# Patient Record
Sex: Female | Born: 1970 | Race: White | Hispanic: No | Marital: Married | Smoking: Never smoker
Health system: Southern US, Community
[De-identification: ages and names within clinical notes are randomized; demographics above are authoritative.]

## PROBLEM LIST (undated history)

## (undated) DIAGNOSIS — K219 Gastro-esophageal reflux disease without esophagitis: Secondary | ICD-10-CM

## (undated) DIAGNOSIS — E039 Hypothyroidism, unspecified: Secondary | ICD-10-CM

## (undated) DIAGNOSIS — F419 Anxiety disorder, unspecified: Secondary | ICD-10-CM

## (undated) DIAGNOSIS — F32A Depression, unspecified: Secondary | ICD-10-CM

## (undated) DIAGNOSIS — M1990 Unspecified osteoarthritis, unspecified site: Secondary | ICD-10-CM

## (undated) DIAGNOSIS — I10 Essential (primary) hypertension: Secondary | ICD-10-CM

## (undated) DIAGNOSIS — F329 Major depressive disorder, single episode, unspecified: Secondary | ICD-10-CM

## (undated) DIAGNOSIS — R928 Other abnormal and inconclusive findings on diagnostic imaging of breast: Secondary | ICD-10-CM

## (undated) HISTORY — PX: BREAST SURGERY: SHX581

## (undated) HISTORY — PX: CHOLECYSTECTOMY: SHX55

---

## 2000-10-26 ENCOUNTER — Encounter: Admission: RE | Admit: 2000-10-26 | Discharge: 2000-10-26 | Payer: Self-pay

## 2015-12-22 DIAGNOSIS — Z1389 Encounter for screening for other disorder: Secondary | ICD-10-CM | POA: Diagnosis not present

## 2015-12-22 DIAGNOSIS — I10 Essential (primary) hypertension: Secondary | ICD-10-CM | POA: Diagnosis not present

## 2015-12-22 DIAGNOSIS — E041 Nontoxic single thyroid nodule: Secondary | ICD-10-CM | POA: Diagnosis not present

## 2015-12-22 DIAGNOSIS — K219 Gastro-esophageal reflux disease without esophagitis: Secondary | ICD-10-CM | POA: Diagnosis not present

## 2015-12-23 DIAGNOSIS — Z6841 Body Mass Index (BMI) 40.0 and over, adult: Secondary | ICD-10-CM | POA: Diagnosis not present

## 2015-12-23 DIAGNOSIS — Z01419 Encounter for gynecological examination (general) (routine) without abnormal findings: Secondary | ICD-10-CM | POA: Diagnosis not present

## 2015-12-24 ENCOUNTER — Other Ambulatory Visit: Payer: Self-pay | Admitting: Obstetrics & Gynecology

## 2015-12-24 DIAGNOSIS — R922 Inconclusive mammogram: Secondary | ICD-10-CM

## 2015-12-30 ENCOUNTER — Other Ambulatory Visit: Payer: Self-pay | Admitting: Obstetrics & Gynecology

## 2015-12-30 ENCOUNTER — Ambulatory Visit
Admission: RE | Admit: 2015-12-30 | Discharge: 2015-12-30 | Disposition: A | Discharge status: Home / Self Care | Payer: BLUE CROSS/BLUE SHIELD | Source: Ambulatory Visit | Attending: Obstetrics & Gynecology | Admitting: Obstetrics & Gynecology

## 2015-12-30 DIAGNOSIS — R922 Inconclusive mammogram: Secondary | ICD-10-CM

## 2015-12-30 DIAGNOSIS — N63 Unspecified lump in breast: Secondary | ICD-10-CM | POA: Diagnosis not present

## 2015-12-31 DIAGNOSIS — E669 Obesity, unspecified: Secondary | ICD-10-CM | POA: Diagnosis not present

## 2015-12-31 DIAGNOSIS — E010 Iodine-deficiency related diffuse (endemic) goiter: Secondary | ICD-10-CM | POA: Diagnosis not present

## 2016-01-08 ENCOUNTER — Ambulatory Visit
Admission: RE | Admit: 2016-01-08 | Discharge: 2016-01-08 | Disposition: A | Discharge status: Home / Self Care | Payer: BLUE CROSS/BLUE SHIELD | Source: Ambulatory Visit | Attending: Obstetrics & Gynecology | Admitting: Obstetrics & Gynecology

## 2016-01-08 ENCOUNTER — Other Ambulatory Visit: Payer: Self-pay | Admitting: Obstetrics & Gynecology

## 2016-01-08 DIAGNOSIS — R922 Inconclusive mammogram: Secondary | ICD-10-CM

## 2016-01-08 DIAGNOSIS — N641 Fat necrosis of breast: Secondary | ICD-10-CM | POA: Diagnosis not present

## 2016-01-08 DIAGNOSIS — N63 Unspecified lump in breast: Secondary | ICD-10-CM | POA: Diagnosis not present

## 2016-02-27 DIAGNOSIS — D485 Neoplasm of uncertain behavior of skin: Secondary | ICD-10-CM | POA: Diagnosis not present

## 2016-02-27 DIAGNOSIS — L578 Other skin changes due to chronic exposure to nonionizing radiation: Secondary | ICD-10-CM | POA: Diagnosis not present

## 2016-02-27 DIAGNOSIS — D225 Melanocytic nevi of trunk: Secondary | ICD-10-CM | POA: Diagnosis not present

## 2016-03-04 DIAGNOSIS — D225 Melanocytic nevi of trunk: Secondary | ICD-10-CM | POA: Diagnosis not present

## 2016-03-25 DIAGNOSIS — D485 Neoplasm of uncertain behavior of skin: Secondary | ICD-10-CM | POA: Diagnosis not present

## 2016-04-21 DIAGNOSIS — M1711 Unilateral primary osteoarthritis, right knee: Secondary | ICD-10-CM | POA: Diagnosis not present

## 2016-04-21 DIAGNOSIS — S8391XA Sprain of unspecified site of right knee, initial encounter: Secondary | ICD-10-CM | POA: Diagnosis not present

## 2016-04-21 DIAGNOSIS — X58XXXA Exposure to other specified factors, initial encounter: Secondary | ICD-10-CM | POA: Diagnosis not present

## 2016-04-21 DIAGNOSIS — R600 Localized edema: Secondary | ICD-10-CM | POA: Diagnosis not present

## 2016-04-21 DIAGNOSIS — M25561 Pain in right knee: Secondary | ICD-10-CM | POA: Diagnosis not present

## 2016-04-22 DIAGNOSIS — M25561 Pain in right knee: Secondary | ICD-10-CM | POA: Diagnosis not present

## 2016-04-22 DIAGNOSIS — S83401A Sprain of unspecified collateral ligament of right knee, initial encounter: Secondary | ICD-10-CM | POA: Diagnosis not present

## 2016-04-28 DIAGNOSIS — M1711 Unilateral primary osteoarthritis, right knee: Secondary | ICD-10-CM | POA: Diagnosis not present

## 2016-04-28 DIAGNOSIS — M25561 Pain in right knee: Secondary | ICD-10-CM | POA: Diagnosis not present

## 2016-05-22 DIAGNOSIS — S83241A Other tear of medial meniscus, current injury, right knee, initial encounter: Secondary | ICD-10-CM | POA: Diagnosis not present

## 2016-05-22 DIAGNOSIS — M25561 Pain in right knee: Secondary | ICD-10-CM | POA: Diagnosis not present

## 2016-05-31 DIAGNOSIS — M1711 Unilateral primary osteoarthritis, right knee: Secondary | ICD-10-CM | POA: Diagnosis not present

## 2016-05-31 DIAGNOSIS — S83241A Other tear of medial meniscus, current injury, right knee, initial encounter: Secondary | ICD-10-CM | POA: Diagnosis not present

## 2016-06-01 DIAGNOSIS — E079 Disorder of thyroid, unspecified: Secondary | ICD-10-CM | POA: Diagnosis not present

## 2016-06-01 DIAGNOSIS — I10 Essential (primary) hypertension: Secondary | ICD-10-CM | POA: Diagnosis not present

## 2016-06-01 DIAGNOSIS — K219 Gastro-esophageal reflux disease without esophagitis: Secondary | ICD-10-CM | POA: Diagnosis not present

## 2016-06-01 DIAGNOSIS — Z23 Encounter for immunization: Secondary | ICD-10-CM | POA: Diagnosis not present

## 2016-06-01 DIAGNOSIS — M25561 Pain in right knee: Secondary | ICD-10-CM | POA: Diagnosis not present

## 2016-06-01 DIAGNOSIS — Z01818 Encounter for other preprocedural examination: Secondary | ICD-10-CM | POA: Diagnosis not present

## 2016-06-03 DIAGNOSIS — I10 Essential (primary) hypertension: Secondary | ICD-10-CM | POA: Diagnosis not present

## 2016-06-03 DIAGNOSIS — E079 Disorder of thyroid, unspecified: Secondary | ICD-10-CM | POA: Diagnosis not present

## 2016-06-14 DIAGNOSIS — M1711 Unilateral primary osteoarthritis, right knee: Secondary | ICD-10-CM | POA: Diagnosis not present

## 2016-06-14 DIAGNOSIS — S83241A Other tear of medial meniscus, current injury, right knee, initial encounter: Secondary | ICD-10-CM | POA: Diagnosis not present

## 2016-06-14 DIAGNOSIS — M25561 Pain in right knee: Secondary | ICD-10-CM | POA: Diagnosis not present

## 2016-06-17 DIAGNOSIS — M1711 Unilateral primary osteoarthritis, right knee: Secondary | ICD-10-CM | POA: Diagnosis not present

## 2016-06-17 DIAGNOSIS — M2341 Loose body in knee, right knee: Secondary | ICD-10-CM | POA: Diagnosis not present

## 2016-06-17 DIAGNOSIS — F329 Major depressive disorder, single episode, unspecified: Secondary | ICD-10-CM | POA: Diagnosis not present

## 2016-06-17 DIAGNOSIS — S83241A Other tear of medial meniscus, current injury, right knee, initial encounter: Secondary | ICD-10-CM | POA: Diagnosis not present

## 2016-06-17 DIAGNOSIS — Z6841 Body Mass Index (BMI) 40.0 and over, adult: Secondary | ICD-10-CM | POA: Diagnosis not present

## 2016-06-17 DIAGNOSIS — X58XXXA Exposure to other specified factors, initial encounter: Secondary | ICD-10-CM | POA: Diagnosis not present

## 2016-06-17 DIAGNOSIS — K219 Gastro-esophageal reflux disease without esophagitis: Secondary | ICD-10-CM | POA: Diagnosis not present

## 2016-06-17 DIAGNOSIS — M2241 Chondromalacia patellae, right knee: Secondary | ICD-10-CM | POA: Diagnosis not present

## 2016-06-17 DIAGNOSIS — F419 Anxiety disorder, unspecified: Secondary | ICD-10-CM | POA: Diagnosis not present

## 2016-06-17 DIAGNOSIS — Z79899 Other long term (current) drug therapy: Secondary | ICD-10-CM | POA: Diagnosis not present

## 2016-06-17 DIAGNOSIS — M65861 Other synovitis and tenosynovitis, right lower leg: Secondary | ICD-10-CM | POA: Diagnosis not present

## 2016-06-17 DIAGNOSIS — I10 Essential (primary) hypertension: Secondary | ICD-10-CM | POA: Diagnosis not present

## 2016-06-17 DIAGNOSIS — E039 Hypothyroidism, unspecified: Secondary | ICD-10-CM | POA: Diagnosis not present

## 2016-06-17 HISTORY — PX: KNEE ARTHROSCOPY: SUR90

## 2016-06-21 DIAGNOSIS — M25561 Pain in right knee: Secondary | ICD-10-CM | POA: Diagnosis not present

## 2016-06-21 DIAGNOSIS — R2689 Other abnormalities of gait and mobility: Secondary | ICD-10-CM | POA: Diagnosis not present

## 2016-06-21 DIAGNOSIS — M25661 Stiffness of right knee, not elsewhere classified: Secondary | ICD-10-CM | POA: Diagnosis not present

## 2016-06-21 DIAGNOSIS — M6281 Muscle weakness (generalized): Secondary | ICD-10-CM | POA: Diagnosis not present

## 2016-06-23 DIAGNOSIS — M25561 Pain in right knee: Secondary | ICD-10-CM | POA: Diagnosis not present

## 2016-06-23 DIAGNOSIS — M6281 Muscle weakness (generalized): Secondary | ICD-10-CM | POA: Diagnosis not present

## 2016-06-23 DIAGNOSIS — R2689 Other abnormalities of gait and mobility: Secondary | ICD-10-CM | POA: Diagnosis not present

## 2016-06-23 DIAGNOSIS — M25661 Stiffness of right knee, not elsewhere classified: Secondary | ICD-10-CM | POA: Diagnosis not present

## 2016-06-28 DIAGNOSIS — M25661 Stiffness of right knee, not elsewhere classified: Secondary | ICD-10-CM | POA: Diagnosis not present

## 2016-06-28 DIAGNOSIS — R2689 Other abnormalities of gait and mobility: Secondary | ICD-10-CM | POA: Diagnosis not present

## 2016-06-28 DIAGNOSIS — M6281 Muscle weakness (generalized): Secondary | ICD-10-CM | POA: Diagnosis not present

## 2016-06-28 DIAGNOSIS — M25561 Pain in right knee: Secondary | ICD-10-CM | POA: Diagnosis not present

## 2016-07-06 DIAGNOSIS — M25661 Stiffness of right knee, not elsewhere classified: Secondary | ICD-10-CM | POA: Diagnosis not present

## 2016-07-06 DIAGNOSIS — M25561 Pain in right knee: Secondary | ICD-10-CM | POA: Diagnosis not present

## 2016-07-06 DIAGNOSIS — M6281 Muscle weakness (generalized): Secondary | ICD-10-CM | POA: Diagnosis not present

## 2016-07-06 DIAGNOSIS — R2689 Other abnormalities of gait and mobility: Secondary | ICD-10-CM | POA: Diagnosis not present

## 2016-07-12 DIAGNOSIS — M25561 Pain in right knee: Secondary | ICD-10-CM | POA: Diagnosis not present

## 2016-07-12 DIAGNOSIS — R2689 Other abnormalities of gait and mobility: Secondary | ICD-10-CM | POA: Diagnosis not present

## 2016-07-12 DIAGNOSIS — M25661 Stiffness of right knee, not elsewhere classified: Secondary | ICD-10-CM | POA: Diagnosis not present

## 2016-07-12 DIAGNOSIS — M6281 Muscle weakness (generalized): Secondary | ICD-10-CM | POA: Diagnosis not present

## 2016-07-19 DIAGNOSIS — M6281 Muscle weakness (generalized): Secondary | ICD-10-CM | POA: Diagnosis not present

## 2016-07-19 DIAGNOSIS — M25661 Stiffness of right knee, not elsewhere classified: Secondary | ICD-10-CM | POA: Diagnosis not present

## 2016-07-19 DIAGNOSIS — R2689 Other abnormalities of gait and mobility: Secondary | ICD-10-CM | POA: Diagnosis not present

## 2016-07-19 DIAGNOSIS — M25561 Pain in right knee: Secondary | ICD-10-CM | POA: Diagnosis not present

## 2016-07-26 DIAGNOSIS — M1711 Unilateral primary osteoarthritis, right knee: Secondary | ICD-10-CM | POA: Diagnosis not present

## 2016-08-02 DIAGNOSIS — M25561 Pain in right knee: Secondary | ICD-10-CM | POA: Diagnosis not present

## 2016-08-02 DIAGNOSIS — M25661 Stiffness of right knee, not elsewhere classified: Secondary | ICD-10-CM | POA: Diagnosis not present

## 2016-08-02 DIAGNOSIS — R2689 Other abnormalities of gait and mobility: Secondary | ICD-10-CM | POA: Diagnosis not present

## 2016-08-02 DIAGNOSIS — M6281 Muscle weakness (generalized): Secondary | ICD-10-CM | POA: Diagnosis not present

## 2016-10-01 DIAGNOSIS — Z6841 Body Mass Index (BMI) 40.0 and over, adult: Secondary | ICD-10-CM | POA: Diagnosis not present

## 2016-10-01 DIAGNOSIS — J111 Influenza due to unidentified influenza virus with other respiratory manifestations: Secondary | ICD-10-CM | POA: Diagnosis not present

## 2016-11-10 DIAGNOSIS — M79644 Pain in right finger(s): Secondary | ICD-10-CM | POA: Diagnosis not present

## 2016-11-30 DIAGNOSIS — E079 Disorder of thyroid, unspecified: Secondary | ICD-10-CM | POA: Diagnosis not present

## 2016-11-30 DIAGNOSIS — K219 Gastro-esophageal reflux disease without esophagitis: Secondary | ICD-10-CM | POA: Diagnosis not present

## 2016-11-30 DIAGNOSIS — Z136 Encounter for screening for cardiovascular disorders: Secondary | ICD-10-CM | POA: Diagnosis not present

## 2016-11-30 DIAGNOSIS — I10 Essential (primary) hypertension: Secondary | ICD-10-CM | POA: Diagnosis not present

## 2016-11-30 DIAGNOSIS — Z6841 Body Mass Index (BMI) 40.0 and over, adult: Secondary | ICD-10-CM | POA: Diagnosis not present

## 2016-12-09 DIAGNOSIS — M79644 Pain in right finger(s): Secondary | ICD-10-CM | POA: Diagnosis not present

## 2016-12-27 DIAGNOSIS — Z6841 Body Mass Index (BMI) 40.0 and over, adult: Secondary | ICD-10-CM | POA: Diagnosis not present

## 2016-12-27 DIAGNOSIS — Z1231 Encounter for screening mammogram for malignant neoplasm of breast: Secondary | ICD-10-CM | POA: Diagnosis not present

## 2016-12-27 DIAGNOSIS — Z01419 Encounter for gynecological examination (general) (routine) without abnormal findings: Secondary | ICD-10-CM | POA: Diagnosis not present

## 2016-12-30 ENCOUNTER — Other Ambulatory Visit: Payer: Self-pay | Admitting: Obstetrics & Gynecology

## 2016-12-30 DIAGNOSIS — R928 Other abnormal and inconclusive findings on diagnostic imaging of breast: Secondary | ICD-10-CM

## 2016-12-31 DIAGNOSIS — Z6841 Body Mass Index (BMI) 40.0 and over, adult: Secondary | ICD-10-CM | POA: Diagnosis not present

## 2016-12-31 DIAGNOSIS — Z1389 Encounter for screening for other disorder: Secondary | ICD-10-CM | POA: Diagnosis not present

## 2016-12-31 DIAGNOSIS — I10 Essential (primary) hypertension: Secondary | ICD-10-CM | POA: Diagnosis not present

## 2017-01-04 ENCOUNTER — Ambulatory Visit
Admission: RE | Admit: 2017-01-04 | Discharge: 2017-01-04 | Disposition: A | Discharge status: Home / Self Care | Payer: BLUE CROSS/BLUE SHIELD | Source: Ambulatory Visit | Attending: Obstetrics & Gynecology | Admitting: Obstetrics & Gynecology

## 2017-01-04 ENCOUNTER — Other Ambulatory Visit: Payer: Self-pay | Admitting: Obstetrics & Gynecology

## 2017-01-04 ENCOUNTER — Encounter: Payer: Self-pay | Admitting: Radiology

## 2017-01-04 DIAGNOSIS — R928 Other abnormal and inconclusive findings on diagnostic imaging of breast: Secondary | ICD-10-CM

## 2017-01-04 DIAGNOSIS — N6489 Other specified disorders of breast: Secondary | ICD-10-CM

## 2017-01-06 ENCOUNTER — Ambulatory Visit
Admission: RE | Admit: 2017-01-06 | Discharge: 2017-01-06 | Disposition: A | Discharge status: Home / Self Care | Payer: BLUE CROSS/BLUE SHIELD | Source: Ambulatory Visit | Attending: Obstetrics & Gynecology | Admitting: Obstetrics & Gynecology

## 2017-01-06 ENCOUNTER — Other Ambulatory Visit: Payer: Self-pay | Admitting: Obstetrics & Gynecology

## 2017-01-06 DIAGNOSIS — N6489 Other specified disorders of breast: Secondary | ICD-10-CM

## 2017-01-06 DIAGNOSIS — N6012 Diffuse cystic mastopathy of left breast: Secondary | ICD-10-CM | POA: Diagnosis not present

## 2017-01-28 ENCOUNTER — Ambulatory Visit: Payer: Self-pay | Admitting: Surgery

## 2017-01-28 DIAGNOSIS — N6320 Unspecified lump in the left breast, unspecified quadrant: Secondary | ICD-10-CM | POA: Diagnosis not present

## 2017-01-28 NOTE — H&P (Signed)
History of Present Illness Melody Bell(Melody Bell Gwyn MD; 01/28/2017 11:59 AM) The patient is a 46 year old female who presents with a breast mass. Referred by Dr. [name] for left CSL PCP - Heide GuileLynn Lam, NP  This is a 46 year old female who presents status post recent mammogram that showed some architectural distortion in the left breast in the upper outer quadrant. Ultrasound was unremarkable. She underwent stereotactic needle biopsy on 01/06/17. A clip was placed. Biopsy results showed a complex sclerosing lesion, fibrocystic changes with adenosis and calcifications. The patient had a core biopsy of the right breast last year that revealed only cystic changes. She presents now to discuss surgical options.  Menarche age 46 First pregnancy age 46 Breast-feed no Hormones or contraceptives 3 years Last menstrual period - One month ago Family history is negative  CLINICAL DATA: Callback from screening mammogram for possible architectural distortion left breast.  EXAM: 2D DIGITAL DIAGNOSTIC LEFT MAMMOGRAM WITH ADJUNCT TOMO  ULTRASOUND LEFT BREAST  COMPARISON: Previous exam(s).  ACR Breast Density Category b: There are scattered areas of fibroglandular density.  FINDINGS: Spot compression CC and MLO views of the left breast are submitted. There is persistent architectural distortion in the upper-outer quadrant left breast.  Targeted ultrasound is performed, showing no focal abnormal discrete cystic or solid lesion in the upper-outer quadrant left breast that would correlate to the mammographic finding.  IMPRESSION: Suspicious findings.  RECOMMENDATION: 3D tomographic stereotactic core biopsy of left breast architectural distortion.  I have discussed the findings and recommendations with the patient. Results were also provided in writing at the conclusion of the visit. If applicable, a reminder letter will be sent to the patient regarding the next appointment.  BI-RADS  CATEGORY 4: Suspicious.   Electronically Signed By: Sherian ReinWei-Chen Lin M.D. On: 01/04/2017 09:28  CLINICAL DATA: Left breast distortion  EXAM: LEFT BREAST STEREOTACTIC CORE NEEDLE BIOPSY  COMPARISON: Previous exams.  FINDINGS: The patient and I discussed the procedure of stereotactic-guided biopsy including benefits and alternatives. We discussed the high likelihood of a successful procedure. We discussed the risks of the procedure including infection, bleeding, tissue injury, clip migration, and inadequate sampling. Informed written consent was given. The usual time out protocol was performed immediately prior to the procedure.  Using sterile technique and 1% Lidocaine as local anesthetic, under stereotactic guidance, a 9 gauge vacuum assisted device was used to perform core needle biopsy of distortion in the upper outer left breast using a superior approach.  Lesion quadrant: Upper-outer  At the conclusion of the procedure, a coil shaped tissue marker clip was deployed into the biopsy cavity. Follow-up 2-view mammogram was performed and dictated separately.  IMPRESSION: Stereotactic-guided biopsy of left breast distortion. No apparent complications.  Electronically Signed: By: [name] III M.D On: 01/06/2017 09:20  CLINICAL DATA: Evaluate biopsy marker  EXAM: DIAGNOSTIC LEFT MAMMOGRAM POST STEREOTACTIC BIOPSY  COMPARISON: Previous exam(s).  FINDINGS: Mammographic images were obtained following stereotactic guided biopsy of left breast distortion. The coil shaped biopsy marker is at the superior aspect of the biopsied distortion.  IMPRESSION: The biopsy clip is along the superior margin of the biopsied distortion.  Final Assessment: Post Procedure Mammograms for Marker Placement   Electronically Signed By: [name] III M.D On: 01/06/2017 09:27  ADDENDUM: Pathology revealed COMPLEX SCLEROSING LESION, FIBROCYSTIC CHANGES WITH  ADENOSIS AND CALCIFICATIONS of the Left breast, upper outer. This was found to be concordant by Dr. [name], with excision recommended. Pathology results were discussed with the patient by telephone. The  patient reported doing well after the biopsy with tenderness at the site. Post biopsy instructions and care were reviewed and questions were answered. The patient was encouraged to call [facility] for any additional concerns. Surgical consultation has been arranged with Dr. [name] at [facility] on Jan 28, 2017.  Pathology results reported by [name], RN on 01/07/2017.   Electronically Signed By: Gerome Sam III M.D On: 01/07/2017 16:14   Past Surgical History Melody Bell; 01/28/2017 9:13 AM) Breast Biopsy Bilateral. Gallbladder Surgery - Laparoscopic Knee Surgery Right.  Diagnostic Studies History Melody Bell; 01/28/2017 9:13 AM) Colonoscopy never Mammogram within last year Pap Smear 1-5 years ago  Allergies Melody Lines, RMA; 01/28/2017 9:15 AM) Sulfacetamide *CHEMICALS* Penicillin G Potassium *PENICILLINS* Allergies Reconciled  Medication History Melody Lines, RMA; 01/28/2017 9:19 AM) Meloxicam (15MG  Tablet, Oral) Active. Omeprazole (20MG  Capsule DR, Oral) Active. Escitalopram Oxalate (10MG  Tablet, Oral) Active. Lisinopril-Hydrochlorothiazide (20-25MG  Tablet, Oral) Active. Levothyroxine Sodium ( Tablet, Oral) Active. Fluconazole (150MG  Tablet, Oral) Active. Medications Reconciled  Social History Melody Bell; 01/28/2017 9:13 AM) Alcohol use Occasional alcohol use. Caffeine use Carbonated beverages, Tea. No drug use Tobacco use Never smoker.  Family History Melody Bell; 01/28/2017 9:13 AM) Arthritis Mother. Heart Disease Father, Mother. Hypertension Father, Mother.  Pregnancy / Birth History Melody Bell; 01/28/2017 9:13 AM) Age at menarche 10  years. Contraceptive History Oral contraceptives. Gravida 2 Irregular periods Maternal age 39-20 Para 2  Other Problems Melody Bell; 01/28/2017 9:13 AM) Arthritis Cholelithiasis Gastroesophageal Reflux Disease Heart murmur High blood pressure Thyroid Disease     Review of Systems Clotilde Dieter Rogers RMA; 01/28/2017 9:13 AM) General Not Present- Appetite Loss, Chills, Fatigue, Fever, Night Sweats, Weight Gain and Weight Loss. Skin Not Present- Change in Wart/Mole, Dryness, Hives, Jaundice, New Lesions, Non-Healing Wounds, Rash and Ulcer. HEENT Present- Seasonal Allergies and Wears glasses/contact lenses. Not Present- Earache, Hearing Loss, Hoarseness, Nose Bleed, Oral Ulcers, Ringing in the Ears, Sinus Pain, Sore Throat, Visual Disturbances and Yellow Eyes. Respiratory Present- Snoring. Not Present- Bloody sputum, Chronic Cough, Difficulty Breathing and Wheezing. Breast Not Present- Breast Mass, Breast Pain, Nipple Discharge and Skin Changes. Cardiovascular Not Present- Chest Pain, Difficulty Breathing Lying Down, Leg Cramps, Palpitations, Rapid Heart Rate, Shortness of Breath and Swelling of Extremities. Gastrointestinal Not Present- Abdominal Pain, Bloating, Bloody Stool, Change in Bowel Habits, Chronic diarrhea, Constipation, Difficulty Swallowing, Excessive gas, Gets full quickly at meals, Hemorrhoids, Indigestion, Nausea, Rectal Pain and Vomiting. Female Genitourinary Not Present- Frequency, Nocturia, Painful Urination, Pelvic Pain and Urgency. Musculoskeletal Not Present- Back Pain, Joint Pain, Joint Stiffness, Muscle Pain, Muscle Weakness and Swelling of Extremities. Neurological Not Present- Decreased Memory, Fainting, Headaches, Numbness, Seizures, Tingling, Tremor, Trouble walking and Weakness. Psychiatric Not Present- Anxiety, Bipolar, Change in Sleep Pattern, Depression, Fearful and Frequent crying. Endocrine Not Present- Cold Intolerance, Excessive Hunger, Hair  Changes, Heat Intolerance, Hot flashes and New Diabetes. Hematology Not Present- Blood Thinners, Easy Bruising, Excessive bleeding, Gland problems, HIV and Persistent Infections.  Vitals [employer] Rogers RMA; 01/28/2017 9:19 AM) 01/28/2017 9:19 AM Weight: 261.4 lb Height: 64in Body Surface Area: 2.19 m Body Mass Index: 44.87 kg/m  Temp.: 97.57F  Pulse: 100 (Regular)  P.OX: 97% (Room air) BP: 138/88 (Sitting, Left Arm, Standard)      Physical Exam Molli Hazard K. Peta Peachey MD; 01/28/2017 11:59 AM)  The physical exam findings are as follows: Note:WDWN in NAD Eyes: Pupils equal, round; sclera anicteric HENT: Oral mucosa moist; good dentition Neck:  No masses palpated, no thyromegaly Lungs: CTA bilaterally; normal respiratory effort Breasts: symmetric, pendulous; no nipple retraction or discharge Bilateral fibrocystic changes No dominant masses; no lymphadenopathy CV: Regular rate and rhythm; no murmurs; extremities well-perfused with no edema Abd: +bowel sounds, soft, non-tender, no palpable organomegaly; no palpable hernias Skin: Warm, dry; no sign of jaundice Psychiatric - alert and oriented x 4; calm mood and affect    Assessment & Plan Molli Hazard K. Franceen Erisman MD; 01/28/2017 9:50 AM)  MASS OF LEFT BREAST ON MAMMOGRAM (N63.20) Impression: Complex sclerosing lesion - UOQ  Current Plans Schedule for Surgery - Left radioactive seed localized lumpectomy. The surgical procedure has been discussed with the patient. Potential risks, benefits, alternative treatments, and expected outcomes have been explained. All of the patient's questions at this time have been answered. The likelihood of reaching the patient's treatment goal is good. The patient understand the proposed surgical procedure and wishes to proceed.  Melody Bell. Corliss Skains, MD, [facility]  General/ Trauma Surgery  01/28/2017 12:00 PM

## 2017-01-31 DIAGNOSIS — I10 Essential (primary) hypertension: Secondary | ICD-10-CM | POA: Diagnosis not present

## 2017-01-31 DIAGNOSIS — Z6841 Body Mass Index (BMI) 40.0 and over, adult: Secondary | ICD-10-CM | POA: Diagnosis not present

## 2017-01-31 DIAGNOSIS — N6320 Unspecified lump in the left breast, unspecified quadrant: Secondary | ICD-10-CM | POA: Diagnosis not present

## 2017-02-09 ENCOUNTER — Ambulatory Visit: Payer: Self-pay | Admitting: Surgery

## 2017-02-09 ENCOUNTER — Other Ambulatory Visit: Payer: Self-pay | Admitting: Surgery

## 2017-02-09 DIAGNOSIS — N6320 Unspecified lump in the left breast, unspecified quadrant: Secondary | ICD-10-CM

## 2017-02-10 ENCOUNTER — Other Ambulatory Visit: Payer: Self-pay | Admitting: Surgery

## 2017-02-10 DIAGNOSIS — N6320 Unspecified lump in the left breast, unspecified quadrant: Secondary | ICD-10-CM

## 2017-02-23 ENCOUNTER — Encounter (HOSPITAL_BASED_OUTPATIENT_CLINIC_OR_DEPARTMENT_OTHER): Payer: Self-pay | Admitting: *Deleted

## 2017-02-24 ENCOUNTER — Encounter (HOSPITAL_BASED_OUTPATIENT_CLINIC_OR_DEPARTMENT_OTHER)
Admission: RE | Admit: 2017-02-24 | Discharge: 2017-02-24 | Disposition: A | Discharge status: Home / Self Care | Payer: BLUE CROSS/BLUE SHIELD | Source: Ambulatory Visit | Attending: Surgery | Admitting: Surgery

## 2017-02-24 DIAGNOSIS — E039 Hypothyroidism, unspecified: Secondary | ICD-10-CM | POA: Diagnosis not present

## 2017-02-24 DIAGNOSIS — R921 Mammographic calcification found on diagnostic imaging of breast: Secondary | ICD-10-CM | POA: Diagnosis not present

## 2017-02-24 DIAGNOSIS — Z6841 Body Mass Index (BMI) 40.0 and over, adult: Secondary | ICD-10-CM | POA: Diagnosis not present

## 2017-02-24 DIAGNOSIS — K219 Gastro-esophageal reflux disease without esophagitis: Secondary | ICD-10-CM | POA: Diagnosis not present

## 2017-02-24 DIAGNOSIS — Z882 Allergy status to sulfonamides status: Secondary | ICD-10-CM | POA: Diagnosis not present

## 2017-02-24 DIAGNOSIS — M1990 Unspecified osteoarthritis, unspecified site: Secondary | ICD-10-CM | POA: Diagnosis not present

## 2017-02-24 DIAGNOSIS — F329 Major depressive disorder, single episode, unspecified: Secondary | ICD-10-CM | POA: Diagnosis not present

## 2017-02-24 DIAGNOSIS — Z880 Allergy status to penicillin: Secondary | ICD-10-CM | POA: Diagnosis not present

## 2017-02-24 DIAGNOSIS — N6092 Unspecified benign mammary dysplasia of left breast: Secondary | ICD-10-CM | POA: Diagnosis not present

## 2017-02-24 DIAGNOSIS — F419 Anxiety disorder, unspecified: Secondary | ICD-10-CM | POA: Diagnosis not present

## 2017-02-24 DIAGNOSIS — I10 Essential (primary) hypertension: Secondary | ICD-10-CM | POA: Diagnosis not present

## 2017-02-24 LAB — BASIC METABOLIC PANEL
ANION GAP: 9 (ref 5–15)
BUN: 9 mg/dL (ref 6–20)
CALCIUM: 9.4 mg/dL (ref 8.9–10.3)
CO2: 26 mmol/L (ref 22–32)
CREATININE: 0.91 mg/dL (ref 0.44–1.00)
Chloride: 101 mmol/L (ref 101–111)
Glucose, Bld: 129 mg/dL — ABNORMAL HIGH (ref 65–99)
Potassium: 3.4 mmol/L — ABNORMAL LOW (ref 3.5–5.1)
Sodium: 136 mmol/L (ref 135–145)

## 2017-02-24 NOTE — Progress Notes (Signed)
Boost drink given with instructions to complete by 0400 dos, pt verbalized understanding. 

## 2017-03-02 ENCOUNTER — Ambulatory Visit
Admission: RE | Admit: 2017-03-02 | Discharge: 2017-03-02 | Disposition: A | Discharge status: Home / Self Care | Payer: BLUE CROSS/BLUE SHIELD | Source: Ambulatory Visit | Attending: Surgery | Admitting: Surgery

## 2017-03-02 DIAGNOSIS — N6489 Other specified disorders of breast: Secondary | ICD-10-CM | POA: Diagnosis not present

## 2017-03-03 ENCOUNTER — Encounter (HOSPITAL_BASED_OUTPATIENT_CLINIC_OR_DEPARTMENT_OTHER): Payer: Self-pay

## 2017-03-03 ENCOUNTER — Ambulatory Visit (HOSPITAL_BASED_OUTPATIENT_CLINIC_OR_DEPARTMENT_OTHER): Payer: BLUE CROSS/BLUE SHIELD | Admitting: Anesthesiology

## 2017-03-03 ENCOUNTER — Ambulatory Visit (HOSPITAL_BASED_OUTPATIENT_CLINIC_OR_DEPARTMENT_OTHER)
Admission: RE | Admit: 2017-03-03 | Discharge: 2017-03-03 | Disposition: A | Discharge status: Home / Self Care | Payer: BLUE CROSS/BLUE SHIELD | Source: Ambulatory Visit | Attending: Surgery | Admitting: Surgery

## 2017-03-03 ENCOUNTER — Encounter (HOSPITAL_BASED_OUTPATIENT_CLINIC_OR_DEPARTMENT_OTHER)
Admission: RE | Disposition: A | Discharge status: Home / Self Care | Payer: Self-pay | Source: Ambulatory Visit | Attending: Surgery

## 2017-03-03 ENCOUNTER — Ambulatory Visit
Admission: RE | Admit: 2017-03-03 | Discharge: 2017-03-03 | Disposition: A | Discharge status: Home / Self Care | Payer: BLUE CROSS/BLUE SHIELD | Source: Ambulatory Visit | Attending: Surgery | Admitting: Surgery

## 2017-03-03 DIAGNOSIS — Z882 Allergy status to sulfonamides status: Secondary | ICD-10-CM | POA: Diagnosis not present

## 2017-03-03 DIAGNOSIS — F419 Anxiety disorder, unspecified: Secondary | ICD-10-CM | POA: Diagnosis not present

## 2017-03-03 DIAGNOSIS — E039 Hypothyroidism, unspecified: Secondary | ICD-10-CM | POA: Insufficient documentation

## 2017-03-03 DIAGNOSIS — F329 Major depressive disorder, single episode, unspecified: Secondary | ICD-10-CM | POA: Insufficient documentation

## 2017-03-03 DIAGNOSIS — K219 Gastro-esophageal reflux disease without esophagitis: Secondary | ICD-10-CM | POA: Insufficient documentation

## 2017-03-03 DIAGNOSIS — M1990 Unspecified osteoarthritis, unspecified site: Secondary | ICD-10-CM | POA: Diagnosis not present

## 2017-03-03 DIAGNOSIS — N6012 Diffuse cystic mastopathy of left breast: Secondary | ICD-10-CM | POA: Diagnosis not present

## 2017-03-03 DIAGNOSIS — Z880 Allergy status to penicillin: Secondary | ICD-10-CM | POA: Insufficient documentation

## 2017-03-03 DIAGNOSIS — I10 Essential (primary) hypertension: Secondary | ICD-10-CM | POA: Diagnosis not present

## 2017-03-03 DIAGNOSIS — N6092 Unspecified benign mammary dysplasia of left breast: Secondary | ICD-10-CM | POA: Diagnosis not present

## 2017-03-03 DIAGNOSIS — R928 Other abnormal and inconclusive findings on diagnostic imaging of breast: Secondary | ICD-10-CM | POA: Diagnosis not present

## 2017-03-03 DIAGNOSIS — R921 Mammographic calcification found on diagnostic imaging of breast: Secondary | ICD-10-CM | POA: Diagnosis not present

## 2017-03-03 DIAGNOSIS — F418 Other specified anxiety disorders: Secondary | ICD-10-CM | POA: Diagnosis not present

## 2017-03-03 DIAGNOSIS — Z6841 Body Mass Index (BMI) 40.0 and over, adult: Secondary | ICD-10-CM | POA: Diagnosis not present

## 2017-03-03 DIAGNOSIS — N6082 Other benign mammary dysplasias of left breast: Secondary | ICD-10-CM | POA: Diagnosis not present

## 2017-03-03 DIAGNOSIS — N6489 Other specified disorders of breast: Secondary | ICD-10-CM | POA: Diagnosis not present

## 2017-03-03 DIAGNOSIS — N6320 Unspecified lump in the left breast, unspecified quadrant: Secondary | ICD-10-CM

## 2017-03-03 HISTORY — DX: Hypothyroidism, unspecified: E03.9

## 2017-03-03 HISTORY — DX: Unspecified osteoarthritis, unspecified site: M19.90

## 2017-03-03 HISTORY — DX: Depression, unspecified: F32.A

## 2017-03-03 HISTORY — DX: Anxiety disorder, unspecified: F41.9

## 2017-03-03 HISTORY — DX: Gastro-esophageal reflux disease without esophagitis: K21.9

## 2017-03-03 HISTORY — DX: Other abnormal and inconclusive findings on diagnostic imaging of breast: R92.8

## 2017-03-03 HISTORY — DX: Major depressive disorder, single episode, unspecified: F32.9

## 2017-03-03 HISTORY — PX: BREAST LUMPECTOMY WITH RADIOACTIVE SEED LOCALIZATION: SHX6424

## 2017-03-03 HISTORY — DX: Essential (primary) hypertension: I10

## 2017-03-03 SURGERY — BREAST LUMPECTOMY WITH RADIOACTIVE SEED LOCALIZATION
Anesthesia: General | Site: Breast | Laterality: Left

## 2017-03-03 MED ORDER — BUPIVACAINE-EPINEPHRINE (PF) 0.25% -1:200000 IJ SOLN
INTRAMUSCULAR | Status: AC
  Filled 2017-03-03: qty 30

## 2017-03-03 MED ORDER — ONDANSETRON HCL 4 MG/2ML IJ SOLN
INTRAMUSCULAR | Status: DC | PRN
  Administered 2017-03-03: 4 mg via INTRAVENOUS

## 2017-03-03 MED ORDER — GABAPENTIN 300 MG PO CAPS
300.0000 mg | ORAL_CAPSULE | ORAL | Status: AC
  Administered 2017-03-03: 300 mg via ORAL

## 2017-03-03 MED ORDER — CELECOXIB 200 MG PO CAPS
ORAL_CAPSULE | ORAL | Status: AC
  Filled 2017-03-03: qty 2

## 2017-03-03 MED ORDER — ACETAMINOPHEN 500 MG PO TABS
ORAL_TABLET | ORAL | Status: AC
  Filled 2017-03-03: qty 2

## 2017-03-03 MED ORDER — LACTATED RINGERS IV SOLN
INTRAVENOUS | Status: DC
  Administered 2017-03-03 (×2): via INTRAVENOUS

## 2017-03-03 MED ORDER — HYDROCODONE-ACETAMINOPHEN 7.5-325 MG PO TABS: 1.0000 | ORAL_TABLET | Freq: Once | ORAL | Status: DC | PRN

## 2017-03-03 MED ORDER — GABAPENTIN 300 MG PO CAPS
ORAL_CAPSULE | ORAL | Status: AC
  Filled 2017-03-03: qty 1

## 2017-03-03 MED ORDER — CHLORHEXIDINE GLUCONATE CLOTH 2 % EX PADS: 6.0000 | MEDICATED_PAD | Freq: Once | CUTANEOUS | Status: DC

## 2017-03-03 MED ORDER — LIDOCAINE 2% (20 MG/ML) 5 ML SYRINGE
INTRAMUSCULAR | Status: DC | PRN
  Administered 2017-03-03: 60 mg via INTRAVENOUS

## 2017-03-03 MED ORDER — ONDANSETRON HCL 4 MG/2ML IJ SOLN
INTRAMUSCULAR | Status: AC
  Filled 2017-03-03: qty 2

## 2017-03-03 MED ORDER — HYDROCODONE-ACETAMINOPHEN 5-325 MG PO TABS: 1.0000 | ORAL_TABLET | Freq: Four times a day (QID) | ORAL | 0 refills | Status: AC | PRN

## 2017-03-03 MED ORDER — SCOPOLAMINE 1 MG/3DAYS TD PT72: 1.0000 | MEDICATED_PATCH | Freq: Once | TRANSDERMAL | Status: DC | PRN

## 2017-03-03 MED ORDER — DEXAMETHASONE SODIUM PHOSPHATE 10 MG/ML IJ SOLN
INTRAMUSCULAR | Status: AC
  Filled 2017-03-03: qty 1

## 2017-03-03 MED ORDER — CELECOXIB 400 MG PO CAPS
400.0000 mg | ORAL_CAPSULE | ORAL | Status: AC
  Administered 2017-03-03: 400 mg via ORAL

## 2017-03-03 MED ORDER — CELECOXIB 200 MG PO CAPS
ORAL_CAPSULE | ORAL | Status: AC
  Filled 2017-03-03: qty 1

## 2017-03-03 MED ORDER — ACETAMINOPHEN 500 MG PO TABS
1000.0000 mg | ORAL_TABLET | ORAL | Status: AC
  Administered 2017-03-03: 1000 mg via ORAL

## 2017-03-03 MED ORDER — PROPOFOL 500 MG/50ML IV EMUL
INTRAVENOUS | Status: AC
  Filled 2017-03-03: qty 50

## 2017-03-03 MED ORDER — FENTANYL CITRATE (PF) 100 MCG/2ML IJ SOLN
50.0000 ug | INTRAMUSCULAR | Status: DC | PRN
  Administered 2017-03-03: 100 ug via INTRAVENOUS

## 2017-03-03 MED ORDER — CEFAZOLIN SODIUM-DEXTROSE 2-4 GM/100ML-% IV SOLN
2.0000 g | INTRAVENOUS | Status: AC
  Administered 2017-03-03: 2 g via INTRAVENOUS

## 2017-03-03 MED ORDER — PROPOFOL 10 MG/ML IV BOLUS
INTRAVENOUS | Status: DC | PRN
  Administered 2017-03-03: 200 mg via INTRAVENOUS

## 2017-03-03 MED ORDER — KETOROLAC TROMETHAMINE 30 MG/ML IJ SOLN
30.0000 mg | Freq: Once | INTRAMUSCULAR | Status: DC | PRN
  Administered 2017-03-03: 30 mg via INTRAVENOUS

## 2017-03-03 MED ORDER — MIDAZOLAM HCL 2 MG/2ML IJ SOLN
INTRAMUSCULAR | Status: AC
  Filled 2017-03-03: qty 2

## 2017-03-03 MED ORDER — OXYCODONE-ACETAMINOPHEN 5-325 MG PO TABS
ORAL_TABLET | ORAL | Status: AC
  Filled 2017-03-03: qty 1

## 2017-03-03 MED ORDER — HYDROMORPHONE HCL 1 MG/ML IJ SOLN
INTRAMUSCULAR | Status: AC
  Filled 2017-03-03: qty 0.5

## 2017-03-03 MED ORDER — FENTANYL CITRATE (PF) 100 MCG/2ML IJ SOLN
INTRAMUSCULAR | Status: AC
  Filled 2017-03-03: qty 2

## 2017-03-03 MED ORDER — KETOROLAC TROMETHAMINE 30 MG/ML IJ SOLN
INTRAMUSCULAR | Status: AC
  Filled 2017-03-03: qty 1

## 2017-03-03 MED ORDER — HYDROCODONE-ACETAMINOPHEN 5-325 MG PO TABS
ORAL_TABLET | ORAL | Status: AC
  Filled 2017-03-03: qty 1

## 2017-03-03 MED ORDER — BUPIVACAINE-EPINEPHRINE 0.25% -1:200000 IJ SOLN
INTRAMUSCULAR | Status: DC | PRN
  Administered 2017-03-03: 10 mL

## 2017-03-03 MED ORDER — MIDAZOLAM HCL 2 MG/2ML IJ SOLN
1.0000 mg | INTRAMUSCULAR | Status: DC | PRN
  Administered 2017-03-03: 2 mg via INTRAVENOUS

## 2017-03-03 MED ORDER — PROMETHAZINE HCL 25 MG/ML IJ SOLN: 6.2500 mg | INTRAMUSCULAR | Status: DC | PRN

## 2017-03-03 MED ORDER — HYDROMORPHONE HCL 1 MG/ML IJ SOLN
0.2500 mg | INTRAMUSCULAR | Status: DC | PRN
  Administered 2017-03-03: 0.5 mg via INTRAVENOUS

## 2017-03-03 MED ORDER — CEFAZOLIN SODIUM-DEXTROSE 2-4 GM/100ML-% IV SOLN
INTRAVENOUS | Status: AC
  Filled 2017-03-03: qty 100

## 2017-03-03 MED ORDER — LIDOCAINE 2% (20 MG/ML) 5 ML SYRINGE
INTRAMUSCULAR | Status: AC
  Filled 2017-03-03: qty 5

## 2017-03-03 MED ORDER — HYDROCODONE-ACETAMINOPHEN 5-325 MG PO TABS
1.0000 | ORAL_TABLET | Freq: Once | ORAL | Status: AC
  Administered 2017-03-03: 1 via ORAL

## 2017-03-03 MED ORDER — EPHEDRINE SULFATE 50 MG/ML IJ SOLN
INTRAMUSCULAR | Status: DC | PRN
  Administered 2017-03-03: 10 mg via INTRAVENOUS

## 2017-03-03 MED ORDER — DEXAMETHASONE SODIUM PHOSPHATE 4 MG/ML IJ SOLN
INTRAMUSCULAR | Status: DC | PRN
  Administered 2017-03-03: 10 mg via INTRAVENOUS

## 2017-03-03 SURGICAL SUPPLY — 50 items
APPLIER CLIP 9.375 MED OPEN (MISCELLANEOUS)
BENZOIN TINCTURE PRP APPL 2/3 (GAUZE/BANDAGES/DRESSINGS) ×2 IMPLANT
BLADE HEX COATED 2.75 (ELECTRODE) ×2 IMPLANT
BLADE SURG 15 STRL LF DISP TIS (BLADE) ×1 IMPLANT
BLADE SURG 15 STRL SS (BLADE) ×1
CANISTER SUC SOCK COL 7IN (MISCELLANEOUS) IMPLANT
CANISTER SUCT 1200ML W/VALVE (MISCELLANEOUS) ×2 IMPLANT
CHLORAPREP W/TINT 26ML (MISCELLANEOUS) ×2 IMPLANT
CLIP APPLIE 9.375 MED OPEN (MISCELLANEOUS) IMPLANT
COVER BACK TABLE 60X90IN (DRAPES) ×2 IMPLANT
COVER MAYO STAND STRL (DRAPES) ×2 IMPLANT
COVER PROBE W GEL 5X96 (DRAPES) ×2 IMPLANT
DECANTER SPIKE VIAL GLASS SM (MISCELLANEOUS) IMPLANT
DEVICE DUBIN W/COMP PLATE 8390 (MISCELLANEOUS) ×2 IMPLANT
DRAPE LAPAROTOMY 100X72 PEDS (DRAPES) ×2 IMPLANT
DRAPE UTILITY XL STRL (DRAPES) ×2 IMPLANT
DRSG TEGADERM 4X4.75 (GAUZE/BANDAGES/DRESSINGS) ×2 IMPLANT
ELECT BLADE 6.5 .24CM SHAFT (ELECTRODE) ×2 IMPLANT
ELECT REM PT RETURN 9FT ADLT (ELECTROSURGICAL) ×2
ELECTRODE REM PT RTRN 9FT ADLT (ELECTROSURGICAL) ×1 IMPLANT
GAUZE SPONGE 4X4 12PLY STRL LF (GAUZE/BANDAGES/DRESSINGS) ×2 IMPLANT
GLOVE BIO SURGEON STRL SZ 6.5 (GLOVE) ×2 IMPLANT
GLOVE BIO SURGEON STRL SZ7 (GLOVE) ×2 IMPLANT
GLOVE BIOGEL PI IND STRL 7.5 (GLOVE) ×1 IMPLANT
GLOVE BIOGEL PI INDICATOR 7.5 (GLOVE) ×1
GLOVE EXAM NITRILE EXT CUFF MD (GLOVE) ×2 IMPLANT
GOWN STRL REUS W/ TWL LRG LVL3 (GOWN DISPOSABLE) ×2 IMPLANT
GOWN STRL REUS W/TWL LRG LVL3 (GOWN DISPOSABLE) ×2
ILLUMINATOR WAVEGUIDE N/F (MISCELLANEOUS) ×2 IMPLANT
KIT MARKER MARGIN INK (KITS) ×2 IMPLANT
LIGHT WAVEGUIDE WIDE FLAT (MISCELLANEOUS) IMPLANT
NEEDLE HYPO 25X1 1.5 SAFETY (NEEDLE) ×2 IMPLANT
NS IRRIG 1000ML POUR BTL (IV SOLUTION) ×2 IMPLANT
PACK BASIN DAY SURGERY FS (CUSTOM PROCEDURE TRAY) ×2 IMPLANT
PENCIL BUTTON HOLSTER BLD 10FT (ELECTRODE) ×2 IMPLANT
SLEEVE SCD COMPRESS KNEE MED (MISCELLANEOUS) ×2 IMPLANT
SPONGE GAUZE 2X2 8PLY STRL LF (GAUZE/BANDAGES/DRESSINGS) IMPLANT
SPONGE LAP 18X18 X RAY DECT (DISPOSABLE) IMPLANT
SPONGE LAP 4X18 X RAY DECT (DISPOSABLE) ×2 IMPLANT
STRIP CLOSURE SKIN 1/2X4 (GAUZE/BANDAGES/DRESSINGS) ×2 IMPLANT
SUT MON AB 4-0 PC3 18 (SUTURE) ×2 IMPLANT
SUT SILK 2 0 SH (SUTURE) IMPLANT
SUT VIC AB 3-0 SH 27 (SUTURE) ×1
SUT VIC AB 3-0 SH 27X BRD (SUTURE) ×1 IMPLANT
SYR BULB 3OZ (MISCELLANEOUS) IMPLANT
SYR CONTROL 10ML LL (SYRINGE) ×2 IMPLANT
TOWEL OR 17X24 6PK STRL BLUE (TOWEL DISPOSABLE) ×2 IMPLANT
TOWEL OR NON WOVEN STRL DISP B (DISPOSABLE) ×2 IMPLANT
TUBE CONNECTING 20X1/4 (TUBING) ×2 IMPLANT
YANKAUER SUCT BULB TIP NO VENT (SUCTIONS) ×2 IMPLANT

## 2017-03-03 NOTE — Op Note (Signed)
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 Anesthesia:  GEN - LMA Indications:  This is a 46 year old female who presents status post recent mammogram that showed some architectural distortion in the left breast in the upper outer quadrant. Ultrasound was unremarkable. She underwent stereotactic needle biopsy on 01/06/17. A clip was placed. Biopsy results showed a complex sclerosing lesion, fibrocystic changes with adenosis and calcifications. The patient had a core biopsy of the right breast last year that revealed only cystic changes. She presents now for excision.  Seed was placed yesterday by Radiology.  Description of procedure: The patient is brought to the operating room placed in supine position on the operating room table. After an adequate level of general anesthesia was obtained, her left breast was prepped with ChloraPrep and draped in sterile fashion. A timeout was taken to ensure the proper patient and proper procedure. We interrogated the breast with the neoprobe. We made a circumareolar incision around the lateral side of the nipple after infiltrating with 0.25% Marcaine. Dissection was carried down in the breast tissue with cautery. We directed our dissection towards the left upper outer quadrant.  We used the neoprobe to guide [place] towards the radioactive seed. We excised an area of tissue around the radioactive seed about 2 cm in diameter. The specimen was removed and was oriented with a paint kit. Specimen mammogram showed the radioactive seed as well as the biopsy clip within the specimen. This was sent for pathologic examination. There is no residual radioactivity within the biopsy cavity. We inspected carefully for hemostasis. The wound was thoroughly irrigated. The wound was closed with a deep layer of 3-0 Vicryl and a subcuticular layer of 4-0  Monocryl. Benzoin Steri-Strips were applied. The patient was then extubated and brought to the recovery room in stable condition. All sponge, instrument, and needle counts are correct.  Imogene Burn. Georgette Dover, MD, [facility]  General/ Trauma Surgery  03/03/2017 8:43 AM

## 2017-03-03 NOTE — Anesthesia Procedure Notes (Signed)
Procedure Name: LMA Insertion Date/Time: 03/03/2017 7:17 AM Performed by: Burna CashONRAD, Gentle Hoge C Pre-anesthesia Checklist: Patient identified, Emergency Drugs available, Suction available and Patient being monitored Patient Re-evaluated:Patient Re-evaluated prior to inductionOxygen Delivery Method: Circle System Utilized Preoxygenation: Pre-oxygenation with 100% oxygen Intubation Type: IV induction Ventilation: Mask ventilation without difficulty LMA: LMA inserted LMA Size: 4.0 Number of attempts: 1 Airway Equipment and Method: bite block Placement Confirmation: positive ETCO2 Tube secured with: Tape Dental Injury: Teeth and Oropharynx as per pre-operative assessment

## 2017-03-03 NOTE — Anesthesia Postprocedure Evaluation (Signed)
Anesthesia Post Note  Patient: Melody Bell  Procedure(s) Performed: Procedure(s) (LRB): LEFT BREAST LUMPECTOMY WITH RADIOACTIVE SEED LOCALIZATION (Left)     Patient location during evaluation: PACU Anesthesia Type: General Level of consciousness: awake and alert Pain management: pain level controlled Vital Signs Assessment: post-procedure vital signs reviewed and stable Respiratory status: spontaneous breathing, nonlabored ventilation, respiratory function stable and patient connected to nasal cannula oxygen Cardiovascular status: blood pressure returned to baseline and stable Postop Assessment: no signs of nausea or vomiting Anesthetic complications: no    Last Vitals:  Vitals:   03/03/17 0925 03/03/17 0939  BP:  (!) 162/75  Pulse: 77 80  Resp: 19 20  Temp:  36.6 C    Last Pain:  Vitals:   03/03/17 0939  TempSrc:   PainSc: 3                  Kennieth RadFitzgerald, Glenn Christo E

## 2017-03-03 NOTE — Discharge Instructions (Signed)
[facility] °Office Phone Number [phone] ° °BREAST BIOPSY/ PARTIAL MASTECTOMY: POST OP INSTRUCTIONS ° °Always review your discharge instruction sheet given to you by the facility where your surgery was performed. ° °IF YOU HAVE DISABILITY OR FAMILY LEAVE FORMS, YOU MUST BRING THEM TO THE OFFICE FOR PROCESSING.  DO NOT GIVE THEM TO YOUR DOCTOR. ° °1. A prescription for pain medication may be given to you upon discharge.  Take your pain medication as prescribed, if needed.  If narcotic pain medicine is not needed, then you may take acetaminophen (Tylenol) or ibuprofen (Advil) as needed. °2. Take your usually prescribed medications unless otherwise directed °3. If you need a refill on your pain medication, please contact your pharmacy.  They will contact our office to request authorization.  Prescriptions will not be filled after 5pm or on week-ends. °4. You should eat very light the first 24 hours after surgery, such as soup, crackers, pudding, etc.  Resume your normal diet the day after surgery. °5. Most patients will experience some swelling and bruising in the breast.  Ice packs and a good support bra will help.  Swelling and bruising can take several days to resolve.  °6. It is common to experience some constipation if taking pain medication after surgery.  Increasing fluid intake and taking a stool softener will usually help or prevent this problem from occurring.  A mild laxative (Milk of Magnesia or Miralax) should be taken according to package directions if there are no bowel movements after 48 hours. °7. Unless discharge instructions indicate otherwise, you may remove your bandages 24-48 hours after surgery, and you may shower at that time.  You may have steri-strips (small skin tapes) in place directly over the incision.  These strips should be left on the skin for 7-10 days.  If your surgeon used skin glue on the incision, you may shower in 24 hours.  The glue will flake off over the  next 2-3 weeks.  Any sutures or staples will be removed at the office during your follow-up visit. °8. ACTIVITIES:  You may resume regular daily activities (gradually increasing) beginning the next day.  Wearing a good support bra or sports bra minimizes pain and swelling.  You may have sexual intercourse when it is comfortable. °a. You may drive when you no longer are taking prescription pain medication, you can comfortably wear a seatbelt, and you can safely maneuver your car and apply brakes. °b. RETURN TO WORK:  ______________________________________________________________________________________ °9. You should see your doctor in the office for a follow-up appointment approximately two weeks after your surgery.  Your doctor’s nurse will typically make your follow-up appointment when she calls you with your pathology report.  Expect your pathology report 2-3 business days after your surgery.  You may call to check if you do not hear from us after three days. °10. OTHER INSTRUCTIONS: _______________________________________________________________________________________________ _____________________________________________________________________________________________________________________________________ °_____________________________________________________________________________________________________________________________________ °_____________________________________________________________________________________________________________________________________ ° °WHEN TO CALL YOUR DOCTOR: °1. Fever over 101.0 °2. Nausea and/or vomiting. °3. Extreme swelling or bruising. °4. Continued bleeding from incision. °5. Increased pain, redness, or drainage from the incision. ° °The clinic staff is available to answer your questions during regular business hours.  Please don’t hesitate to call and ask to speak to one of the nurses for clinical concerns.  If you have a medical emergency, go to the nearest  emergency room or call 911.  A surgeon from [facility] is always on call at the hospital. ° °For further questions, please visit [email or web address]  ° ° ° ° °  Post Anesthesia Home Care Instructions ° °Activity: °Get plenty of rest for the remainder of the day. A responsible individual must stay with you for 24 hours following the procedure.  °For the next 24 hours, DO NOT: °-Drive a car °-Operate machinery °-Drink alcoholic beverages °-Take any medication unless instructed by your physician °-Make any legal decisions or sign important papers. ° °Meals: °Start with liquid foods such as gelatin or soup. Progress to regular foods as tolerated. Avoid greasy, spicy, heavy foods. If nausea and/or vomiting occur, drink only clear liquids until the nausea and/or vomiting subsides. Call your physician if vomiting continues. ° °Special Instructions/Symptoms: °Your throat may feel dry or sore from the anesthesia or the breathing tube placed in your throat during surgery. If this causes discomfort, gargle with warm salt water. The discomfort should disappear within 24 hours. ° °If you had a scopolamine patch placed behind your ear for the management of post- operative nausea and/or vomiting: ° °1. The medication in the patch is effective for 72 hours, after which it should be removed.  Wrap patch in a tissue and discard in the trash. Wash hands thoroughly with soap and water. °2. You may remove the patch earlier than 72 hours if you experience unpleasant side effects which may include dry mouth, dizziness or visual disturbances. °3. Avoid touching the patch. Wash your hands with soap and water after contact with the patch. °  ° °

## 2017-03-03 NOTE — H&P (Signed)
History of Present Illness The patient is a 46 year old female who presents with a breast mass. Referred by Dr. Gerome Sam for left CSL PCP - Heide Guile, NP  This is a 46 year old female who presents status post recent mammogram that showed some architectural distortion in the left breast in the upper outer quadrant. Ultrasound was unremarkable. She underwent stereotactic needle biopsy on 01/06/17. A clip was placed. Biopsy results showed a complex sclerosing lesion, fibrocystic changes with adenosis and calcifications. The patient had a core biopsy of the right breast last year that revealed only cystic changes. She presents now to discuss surgical options.  Menarche age 66 First pregnancy age 33 Breast-feed no Hormones or contraceptives 3 years Last menstrual period - One month ago Family history is negative  CLINICAL DATA: Callback from screening mammogram for possible architectural distortion left breast.  EXAM: 2D DIGITAL DIAGNOSTIC LEFT MAMMOGRAM WITH ADJUNCT TOMO  ULTRASOUND LEFT BREAST  COMPARISON: Previous exam(s).  ACR Breast Density Category b: There are scattered areas of fibroglandular density.  FINDINGS: Spot compression CC and MLO views of the left breast are submitted. There is persistent architectural distortion in the upper-outer quadrant left breast.  Targeted ultrasound is performed, showing no focal abnormal discrete cystic or solid lesion in the upper-outer quadrant left breast that would correlate to the mammographic finding.  IMPRESSION: Suspicious findings.  RECOMMENDATION: 3D tomographic stereotactic core biopsy of left breast architectural distortion.  I have discussed the findings and recommendations with the patient. Results were also provided in writing at the conclusion of the visit. If applicable, a reminder letter will be sent to the patient regarding the next appointment.  BI-RADS CATEGORY 4:  Suspicious.   Electronically Signed By: [name] M.D. On: 01/04/2017 09:28  CLINICAL DATA: Left breast distortion  EXAM: LEFT BREAST STEREOTACTIC CORE NEEDLE BIOPSY  COMPARISON: Previous exams.  FINDINGS: The patient and I discussed the procedure of stereotactic-guided biopsy including benefits and alternatives. We discussed the high likelihood of a successful procedure. We discussed the risks of the procedure including infection, bleeding, tissue injury, clip migration, and inadequate sampling. Informed written consent was given. The usual time out protocol was performed immediately prior to the procedure.  Using sterile technique and 1% Lidocaine as local anesthetic, under stereotactic guidance, a 9 gauge vacuum assisted device was used to perform core needle biopsy of distortion in the upper outer left breast using a superior approach.  Lesion quadrant: Upper-outer  At the conclusion of the procedure, a coil shaped tissue marker clip was deployed into the biopsy cavity. Follow-up 2-view mammogram was performed and dictated separately.  IMPRESSION: Stereotactic-guided biopsy of left breast distortion. No apparent complications.  Electronically Signed: By: Gerome Sam III M.D On: 01/06/2017 09:20  CLINICAL DATA: Evaluate biopsy marker  EXAM: DIAGNOSTIC LEFT MAMMOGRAM POST STEREOTACTIC BIOPSY  COMPARISON: Previous exam(s).  FINDINGS: Mammographic images were obtained following stereotactic guided biopsy of left breast distortion. The coil shaped biopsy marker is at the superior aspect of the biopsied distortion.  IMPRESSION: The biopsy clip is along the superior margin of the biopsied distortion.  Final Assessment: Post Procedure Mammograms for Marker Placement   Electronically Signed By: Gerome Sam III M.D On: 01/06/2017 09:27  ADDENDUM: Pathology revealed COMPLEX SCLEROSING LESION, FIBROCYSTIC CHANGES WITH  ADENOSIS AND CALCIFICATIONS of the Left breast, upper outer. This was found to be concordant by Dr. Gerome Sam, with excision recommended. Pathology results were discussed with the patient by telephone. The patient reported doing well after the biopsy  with tenderness at the site. Post biopsy instructions and care were reviewed and questions were answered. The patient was encouraged to call [facility] for any additional concerns. Surgical consultation has been arranged with Dr. [name] at [facility] on Jan 28, 2017.  Pathology results reported by [name], RN on 01/07/2017.   Electronically Signed By: Gerome Sam III M.D On: 01/07/2017 16:14   Past Surgical History Breast Biopsy Bilateral. Gallbladder Surgery - Laparoscopic Knee Surgery Right.  Diagnostic Studies History  Colonoscopy never Mammogram within last year Pap Smear 1-5 years ago  Allergies  Sulfacetamide *CHEMICALS* Penicillin G Potassium *PENICILLINS* Allergies Reconciled  Medication History  Meloxicam (15MG  Tablet, Oral) Active. Omeprazole (20MG  Capsule DR, Oral) Active. Escitalopram Oxalate (10MG  Tablet, Oral) Active. Lisinopril-Hydrochlorothiazide (20-25MG  Tablet, Oral) Active. Levothyroxine Sodium ( Tablet, Oral) Active. Fluconazole (150MG  Tablet, Oral) Active. Medications Reconciled  Social History Alcohol use Occasional alcohol use. Caffeine use Carbonated beverages, Tea. No drug use Tobacco use Never smoker.  Family History  Arthritis Mother. Heart Disease Father, Mother. Hypertension Father, Mother.  Pregnancy / Birth History  Age at menarche 10 years. Contraceptive History Oral contraceptives. Gravida 2 Irregular periods Maternal age 67-20 Para 2  Other Problems  Arthritis Cholelithiasis Gastroesophageal Reflux Disease Heart murmur High blood pressure Thyroid  Disease     Review of Systems General Not Present- Appetite Loss, Chills, Fatigue, Fever, Night Sweats, Weight Gain and Weight Loss. Skin Not Present- Change in Wart/Mole, Dryness, Hives, Jaundice, New Lesions, Non-Healing Wounds, Rash and Ulcer. HEENT Present- Seasonal Allergies and Wears glasses/contact lenses. Not Present- Earache, Hearing Loss, Hoarseness, Nose Bleed, Oral Ulcers, Ringing in the Ears, Sinus Pain, Sore Throat, Visual Disturbances and Yellow Eyes. Respiratory Present- Snoring. Not Present- Bloody sputum, Chronic Cough, Difficulty Breathing and Wheezing. Breast Not Present- Breast Mass, Breast Pain, Nipple Discharge and Skin Changes. Cardiovascular Not Present- Chest Pain, Difficulty Breathing Lying Down, Leg Cramps, Palpitations, Rapid Heart Rate, Shortness of Breath and Swelling of Extremities. Gastrointestinal Not Present- Abdominal Pain, Bloating, Bloody Stool, Change in Bowel Habits, Chronic diarrhea, Constipation, Difficulty Swallowing, Excessive gas, Gets full quickly at meals, Hemorrhoids, Indigestion, Nausea, Rectal Pain and Vomiting. Female Genitourinary Not Present- Frequency, Nocturia, Painful Urination, Pelvic Pain and Urgency. Musculoskeletal Not Present- Back Pain, Joint Pain, Joint Stiffness, Muscle Pain, Muscle Weakness and Swelling of Extremities. Neurological Not Present- Decreased Memory, Fainting, Headaches, Numbness, Seizures, Tingling, Tremor, Trouble walking and Weakness. Psychiatric Not Present- Anxiety, Bipolar, Change in Sleep Pattern, Depression, Fearful and Frequent crying. Endocrine Not Present- Cold Intolerance, Excessive Hunger, Hair Changes, Heat Intolerance, Hot flashes and New Diabetes. Hematology Not Present- Blood Thinners, Easy Bruising, Excessive bleeding, Gland problems, HIV and Persistent Infections.  Vitals  Weight: 261.4 lb Height: 64in Body Surface Area: 2.19 m Body Mass Index: 44.87 kg/m  Temp.: 97.23F  Pulse: 100  (Regular)  P.OX: 97% (Room air) BP: 138/88 (Sitting, Left Arm, Standard)      Physical Exam   The physical exam findings are as follows: Note:WDWN in NAD Eyes: Pupils equal, round; sclera anicteric HENT: Oral mucosa moist; good dentition Neck: No masses palpated, no thyromegaly Lungs: CTA bilaterally; normal respiratory effort Breasts: symmetric, pendulous; no nipple retraction or discharge Bilateral fibrocystic changes No dominant masses; no lymphadenopathy CV: Regular rate and rhythm; no murmurs; extremities well-perfused with no edema Abd: +bowel sounds, soft, non-tender, no palpable organomegaly; no palpable hernias Skin: Warm, dry; no sign of jaundice Psychiatric - alert and oriented x 4; calm mood and affect  Assessment & Plan   MASS OF LEFT BREAST ON MAMMOGRAM (N63.20) Impression: Complex sclerosing lesion - UOQ  Current Plans Schedule for Surgery - Left radioactive seed localized lumpectomy. The surgical procedure has been discussed with the patient. Potential risks, benefits, alternative treatments, and expected outcomes have been explained. All of the patient's questions at this time have been answered. The likelihood of reaching the patient's treatment goal is good. The patient understand the proposed surgical procedure and wishes to proceed.  Wilmon ArmsMatthew K. Corliss Skainssuei, MD, [facility]  General/ Trauma Surgery  03/03/2017 7:05 AM

## 2017-03-03 NOTE — Anesthesia Preprocedure Evaluation (Addendum)
Anesthesia Evaluation  Patient identified by MRN, date of birth, ID band Patient awake    Reviewed: Allergy & Precautions, NPO status , Patient's Chart, lab work & pertinent test results  Airway Mallampati: III  TM Distance: >3 FB Neck ROM: Full    Dental  (+) Dental Advisory Given   Pulmonary neg pulmonary ROS,    breath sounds clear to auscultation       Cardiovascular hypertension, Pt. on medications (-) angina(-) DOE  Rhythm:Regular Rate:Normal     Neuro/Psych Anxiety Depression negative neurological ROS     GI/Hepatic Neg liver ROS, GERD  ,  Endo/Other  Hypothyroidism Morbid obesity  Renal/GU negative Renal ROS     Musculoskeletal  (+) Arthritis ,   Abdominal (+) + obese,   Peds  Hematology negative hematology ROS (+)   Anesthesia Other Findings   Reproductive/Obstetrics                            Anesthesia Physical Anesthesia Plan  ASA: III  Anesthesia Plan: General   Post-op Pain Management:    Induction: Intravenous  PONV Risk Score and Plan: 3 and Ondansetron, Dexamethasone, Propofol and Midazolam  Airway Management Planned: LMA and Oral ETT  Additional Equipment:   Intra-op Plan:   Post-operative Plan: Extubation in OR  Informed Consent: I have reviewed the patients History and Physical, chart, labs and discussed the procedure including the risks, benefits and alternatives for the proposed anesthesia with the patient or authorized representative who has indicated his/her understanding and acceptance.   Dental advisory given  Plan Discussed with: CRNA  Anesthesia Plan Comments:        Anesthesia Quick Evaluation

## 2017-03-03 NOTE — Transfer of Care (Signed)
Immediate Anesthesia Transfer of Care Note  Patient: Melody Bell  Procedure(s) Performed: Procedure(s): LEFT BREAST LUMPECTOMY WITH RADIOACTIVE SEED LOCALIZATION (Left)  Patient Location: PACU  Anesthesia Type:General  Level of Consciousness: sedated  Airway & Oxygen Therapy: Patient Spontanous Breathing and Patient connected to face mask oxygen  Post-op Assessment: Report given to RN and Post -op Vital signs reviewed and stable  Post vital signs: Reviewed and stable  Last Vitals:  Vitals:   03/03/17 0638 03/03/17 0849  BP: (!) 141/68 132/72  Pulse: 70 81  Resp: 18 16  Temp: 36.8 C 36.6 C    Last Pain:  Vitals:   03/03/17 0849  TempSrc:   PainSc: 0-No pain         Complications: No apparent anesthesia complications

## 2017-03-04 ENCOUNTER — Encounter (HOSPITAL_BASED_OUTPATIENT_CLINIC_OR_DEPARTMENT_OTHER): Payer: Self-pay | Admitting: Surgery

## 2017-06-21 DIAGNOSIS — I10 Essential (primary) hypertension: Secondary | ICD-10-CM | POA: Diagnosis not present

## 2017-06-21 DIAGNOSIS — K219 Gastro-esophageal reflux disease without esophagitis: Secondary | ICD-10-CM | POA: Diagnosis not present

## 2017-06-21 DIAGNOSIS — Z23 Encounter for immunization: Secondary | ICD-10-CM | POA: Diagnosis not present

## 2017-06-21 DIAGNOSIS — E079 Disorder of thyroid, unspecified: Secondary | ICD-10-CM | POA: Diagnosis not present

## 2017-07-15 DIAGNOSIS — G5600 Carpal tunnel syndrome, unspecified upper limb: Secondary | ICD-10-CM | POA: Diagnosis not present

## 2017-10-11 DIAGNOSIS — J111 Influenza due to unidentified influenza virus with other respiratory manifestations: Secondary | ICD-10-CM | POA: Diagnosis not present

## 2017-10-11 DIAGNOSIS — Z6841 Body Mass Index (BMI) 40.0 and over, adult: Secondary | ICD-10-CM | POA: Diagnosis not present

## 2017-10-14 DIAGNOSIS — M1712 Unilateral primary osteoarthritis, left knee: Secondary | ICD-10-CM | POA: Diagnosis not present

## 2017-11-10 DIAGNOSIS — M25562 Pain in left knee: Secondary | ICD-10-CM | POA: Diagnosis not present

## 2017-11-11 DIAGNOSIS — S83242A Other tear of medial meniscus, current injury, left knee, initial encounter: Secondary | ICD-10-CM | POA: Diagnosis not present

## 2017-11-14 DIAGNOSIS — I10 Essential (primary) hypertension: Secondary | ICD-10-CM | POA: Diagnosis not present

## 2017-11-14 DIAGNOSIS — K219 Gastro-esophageal reflux disease without esophagitis: Secondary | ICD-10-CM | POA: Diagnosis not present

## 2017-11-14 DIAGNOSIS — E079 Disorder of thyroid, unspecified: Secondary | ICD-10-CM | POA: Diagnosis not present

## 2017-11-14 DIAGNOSIS — Z01818 Encounter for other preprocedural examination: Secondary | ICD-10-CM | POA: Diagnosis not present

## 2017-11-14 DIAGNOSIS — S838X2D Sprain of other specified parts of left knee, subsequent encounter: Secondary | ICD-10-CM | POA: Diagnosis not present

## 2017-11-24 DIAGNOSIS — M23322 Other meniscus derangements, posterior horn of medial meniscus, left knee: Secondary | ICD-10-CM | POA: Diagnosis not present

## 2017-11-24 DIAGNOSIS — Z01810 Encounter for preprocedural cardiovascular examination: Secondary | ICD-10-CM | POA: Diagnosis not present

## 2017-11-24 DIAGNOSIS — K219 Gastro-esophageal reflux disease without esophagitis: Secondary | ICD-10-CM | POA: Diagnosis not present

## 2017-11-24 DIAGNOSIS — S83242A Other tear of medial meniscus, current injury, left knee, initial encounter: Secondary | ICD-10-CM | POA: Diagnosis not present

## 2017-11-24 DIAGNOSIS — M6588 Other synovitis and tenosynovitis, other site: Secondary | ICD-10-CM | POA: Diagnosis not present

## 2017-11-24 DIAGNOSIS — M23262 Derangement of other lateral meniscus due to old tear or injury, left knee: Secondary | ICD-10-CM | POA: Diagnosis not present

## 2017-11-24 DIAGNOSIS — F411 Generalized anxiety disorder: Secondary | ICD-10-CM | POA: Diagnosis not present

## 2017-11-24 DIAGNOSIS — M1712 Unilateral primary osteoarthritis, left knee: Secondary | ICD-10-CM | POA: Diagnosis not present

## 2017-11-24 DIAGNOSIS — M23352 Other meniscus derangements, posterior horn of lateral meniscus, left knee: Secondary | ICD-10-CM | POA: Diagnosis not present

## 2017-11-24 DIAGNOSIS — M2242 Chondromalacia patellae, left knee: Secondary | ICD-10-CM | POA: Diagnosis not present

## 2017-11-24 DIAGNOSIS — F329 Major depressive disorder, single episode, unspecified: Secondary | ICD-10-CM | POA: Diagnosis not present

## 2017-11-24 DIAGNOSIS — Z79899 Other long term (current) drug therapy: Secondary | ICD-10-CM | POA: Diagnosis not present

## 2017-11-24 DIAGNOSIS — I10 Essential (primary) hypertension: Secondary | ICD-10-CM | POA: Diagnosis not present

## 2017-11-24 DIAGNOSIS — E079 Disorder of thyroid, unspecified: Secondary | ICD-10-CM | POA: Diagnosis not present

## 2017-11-24 DIAGNOSIS — M9429 Chondromalacia, multiple sites: Secondary | ICD-10-CM | POA: Diagnosis not present

## 2017-11-24 DIAGNOSIS — Z01818 Encounter for other preprocedural examination: Secondary | ICD-10-CM | POA: Diagnosis not present

## 2017-11-24 DIAGNOSIS — Z6841 Body Mass Index (BMI) 40.0 and over, adult: Secondary | ICD-10-CM | POA: Diagnosis not present

## 2017-11-28 DIAGNOSIS — M25562 Pain in left knee: Secondary | ICD-10-CM | POA: Diagnosis not present

## 2017-11-28 DIAGNOSIS — M1712 Unilateral primary osteoarthritis, left knee: Secondary | ICD-10-CM | POA: Diagnosis not present

## 2017-11-28 DIAGNOSIS — M6281 Muscle weakness (generalized): Secondary | ICD-10-CM | POA: Diagnosis not present

## 2017-11-28 DIAGNOSIS — R2689 Other abnormalities of gait and mobility: Secondary | ICD-10-CM | POA: Diagnosis not present

## 2017-12-06 ENCOUNTER — Other Ambulatory Visit: Payer: Self-pay | Admitting: Obstetrics & Gynecology

## 2017-12-06 DIAGNOSIS — Z139 Encounter for screening, unspecified: Secondary | ICD-10-CM

## 2017-12-14 DIAGNOSIS — R2689 Other abnormalities of gait and mobility: Secondary | ICD-10-CM | POA: Diagnosis not present

## 2017-12-14 DIAGNOSIS — M1712 Unilateral primary osteoarthritis, left knee: Secondary | ICD-10-CM | POA: Diagnosis not present

## 2017-12-14 DIAGNOSIS — M6281 Muscle weakness (generalized): Secondary | ICD-10-CM | POA: Diagnosis not present

## 2017-12-14 DIAGNOSIS — M25562 Pain in left knee: Secondary | ICD-10-CM | POA: Diagnosis not present

## 2017-12-15 DIAGNOSIS — M1712 Unilateral primary osteoarthritis, left knee: Secondary | ICD-10-CM | POA: Diagnosis not present

## 2017-12-15 DIAGNOSIS — M25562 Pain in left knee: Secondary | ICD-10-CM | POA: Diagnosis not present

## 2018-01-18 ENCOUNTER — Ambulatory Visit: Payer: BLUE CROSS/BLUE SHIELD

## 2018-01-18 ENCOUNTER — Ambulatory Visit
Admission: RE | Admit: 2018-01-18 | Discharge: 2018-01-18 | Disposition: A | Discharge status: Home / Self Care | Payer: BLUE CROSS/BLUE SHIELD | Source: Ambulatory Visit | Attending: Obstetrics & Gynecology | Admitting: Obstetrics & Gynecology

## 2018-01-18 DIAGNOSIS — Z139 Encounter for screening, unspecified: Secondary | ICD-10-CM

## 2018-01-18 DIAGNOSIS — Z1231 Encounter for screening mammogram for malignant neoplasm of breast: Secondary | ICD-10-CM | POA: Diagnosis not present

## 2018-01-18 DIAGNOSIS — Z6841 Body Mass Index (BMI) 40.0 and over, adult: Secondary | ICD-10-CM | POA: Diagnosis not present

## 2018-01-18 DIAGNOSIS — Z01419 Encounter for gynecological examination (general) (routine) without abnormal findings: Secondary | ICD-10-CM | POA: Diagnosis not present

## 2018-04-03 DIAGNOSIS — M1712 Unilateral primary osteoarthritis, left knee: Secondary | ICD-10-CM | POA: Diagnosis not present

## 2018-04-10 DIAGNOSIS — M1712 Unilateral primary osteoarthritis, left knee: Secondary | ICD-10-CM | POA: Diagnosis not present

## 2018-04-17 DIAGNOSIS — M1712 Unilateral primary osteoarthritis, left knee: Secondary | ICD-10-CM | POA: Diagnosis not present

## 2018-05-17 DIAGNOSIS — M1712 Unilateral primary osteoarthritis, left knee: Secondary | ICD-10-CM | POA: Diagnosis not present

## 2018-06-06 DIAGNOSIS — M1712 Unilateral primary osteoarthritis, left knee: Secondary | ICD-10-CM | POA: Diagnosis not present

## 2018-06-26 DIAGNOSIS — Z23 Encounter for immunization: Secondary | ICD-10-CM | POA: Diagnosis not present

## 2018-06-26 DIAGNOSIS — F411 Generalized anxiety disorder: Secondary | ICD-10-CM | POA: Diagnosis not present

## 2018-06-26 DIAGNOSIS — Z1331 Encounter for screening for depression: Secondary | ICD-10-CM | POA: Diagnosis not present

## 2018-06-26 DIAGNOSIS — I10 Essential (primary) hypertension: Secondary | ICD-10-CM | POA: Diagnosis not present

## 2018-06-26 DIAGNOSIS — K219 Gastro-esophageal reflux disease without esophagitis: Secondary | ICD-10-CM | POA: Diagnosis not present

## 2018-06-26 DIAGNOSIS — E079 Disorder of thyroid, unspecified: Secondary | ICD-10-CM | POA: Diagnosis not present

## 2018-07-27 DIAGNOSIS — M1712 Unilateral primary osteoarthritis, left knee: Secondary | ICD-10-CM | POA: Diagnosis not present

## 2018-08-21 DIAGNOSIS — M1712 Unilateral primary osteoarthritis, left knee: Secondary | ICD-10-CM | POA: Diagnosis not present

## 2018-12-25 DIAGNOSIS — I10 Essential (primary) hypertension: Secondary | ICD-10-CM | POA: Diagnosis not present

## 2018-12-25 DIAGNOSIS — Z136 Encounter for screening for cardiovascular disorders: Secondary | ICD-10-CM | POA: Diagnosis not present

## 2018-12-25 DIAGNOSIS — E079 Disorder of thyroid, unspecified: Secondary | ICD-10-CM | POA: Diagnosis not present

## 2018-12-26 DIAGNOSIS — I10 Essential (primary) hypertension: Secondary | ICD-10-CM | POA: Diagnosis not present

## 2018-12-26 DIAGNOSIS — F411 Generalized anxiety disorder: Secondary | ICD-10-CM | POA: Diagnosis not present

## 2018-12-26 DIAGNOSIS — K219 Gastro-esophageal reflux disease without esophagitis: Secondary | ICD-10-CM | POA: Diagnosis not present

## 2018-12-26 DIAGNOSIS — E079 Disorder of thyroid, unspecified: Secondary | ICD-10-CM | POA: Diagnosis not present

## 2019-01-09 ENCOUNTER — Other Ambulatory Visit: Payer: Self-pay | Admitting: Obstetrics & Gynecology

## 2019-01-09 DIAGNOSIS — Z1231 Encounter for screening mammogram for malignant neoplasm of breast: Secondary | ICD-10-CM

## 2019-04-04 ENCOUNTER — Ambulatory Visit
Admission: RE | Admit: 2019-04-04 | Discharge: 2019-04-04 | Disposition: A | Discharge status: Home / Self Care | Payer: BC Managed Care – PPO | Source: Ambulatory Visit | Attending: Obstetrics & Gynecology | Admitting: Obstetrics & Gynecology

## 2019-04-04 ENCOUNTER — Other Ambulatory Visit: Payer: Self-pay

## 2019-04-04 DIAGNOSIS — Z1231 Encounter for screening mammogram for malignant neoplasm of breast: Secondary | ICD-10-CM | POA: Diagnosis not present

## 2019-04-04 DIAGNOSIS — Z6841 Body Mass Index (BMI) 40.0 and over, adult: Secondary | ICD-10-CM | POA: Diagnosis not present

## 2019-04-04 DIAGNOSIS — Z01419 Encounter for gynecological examination (general) (routine) without abnormal findings: Secondary | ICD-10-CM | POA: Diagnosis not present

## 2019-04-18 IMAGING — MG STEREOTACTIC CORE NEEDLE BIOPSY
3 series · 3 of 11 positions shown · non-contrast
Comparison: Previous exams.

ADDENDUM:
Pathology revealed COMPLEX SCLEROSING LESION, FIBROCYSTIC CHANGES
WITH ADENOSIS AND CALCIFICATIONS of the Left breast, upper outer.
This was found to be concordant by Dr. Eucilene Geovana, with excision
recommended. Pathology results were discussed with the patient by
telephone. The patient reported doing well after the biopsy with
tenderness at the site. Post biopsy instructions and care were
reviewed and questions were answered. The patient was encouraged to
call The [REDACTED] for any additional
concerns. Surgical consultation has been arranged with Dr. Noddeland
Fernando Guerreiro at [[email or web address]] on January 28, 2017.

Pathology results reported by Marnisi Rayani, RN on 01/07/2017.
CLINICAL DATA: Left breast distortion
EXAM:
LEFT BREAST STEREOTACTIC CORE NEEDLE BIOPSY

[L CC]
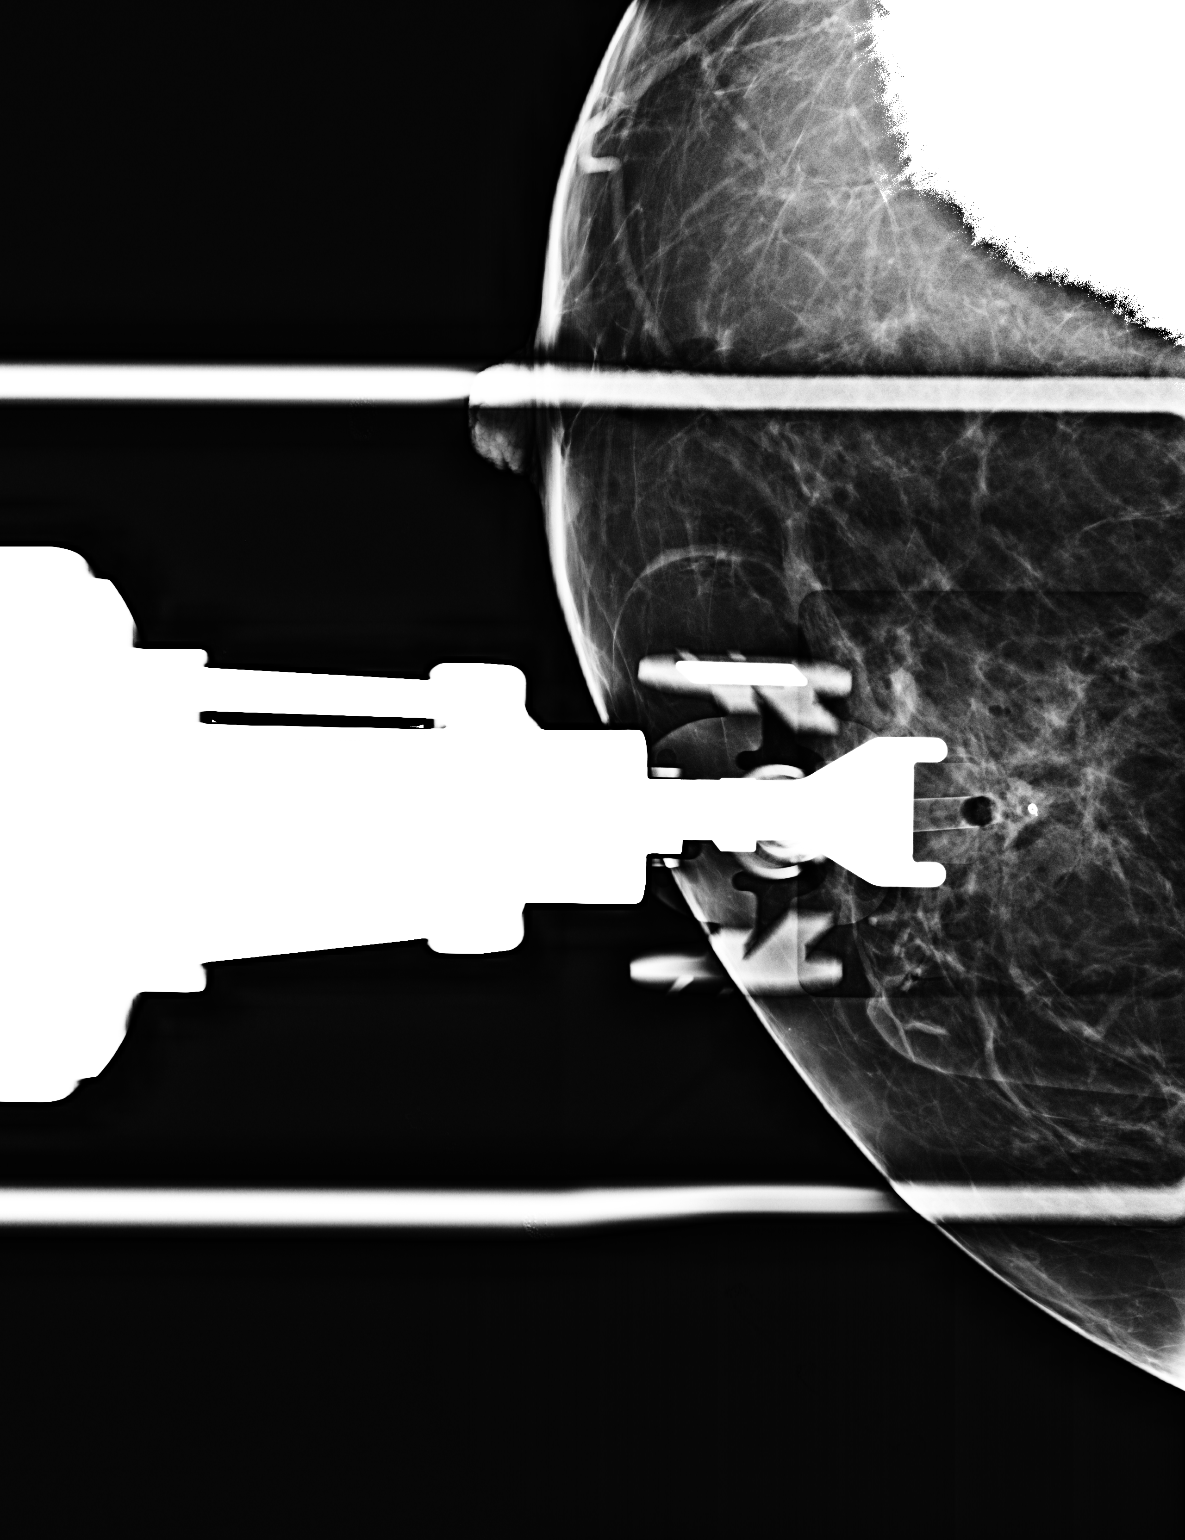

[L CC tomo (1 of 2) · tomo slice 33/64.0]
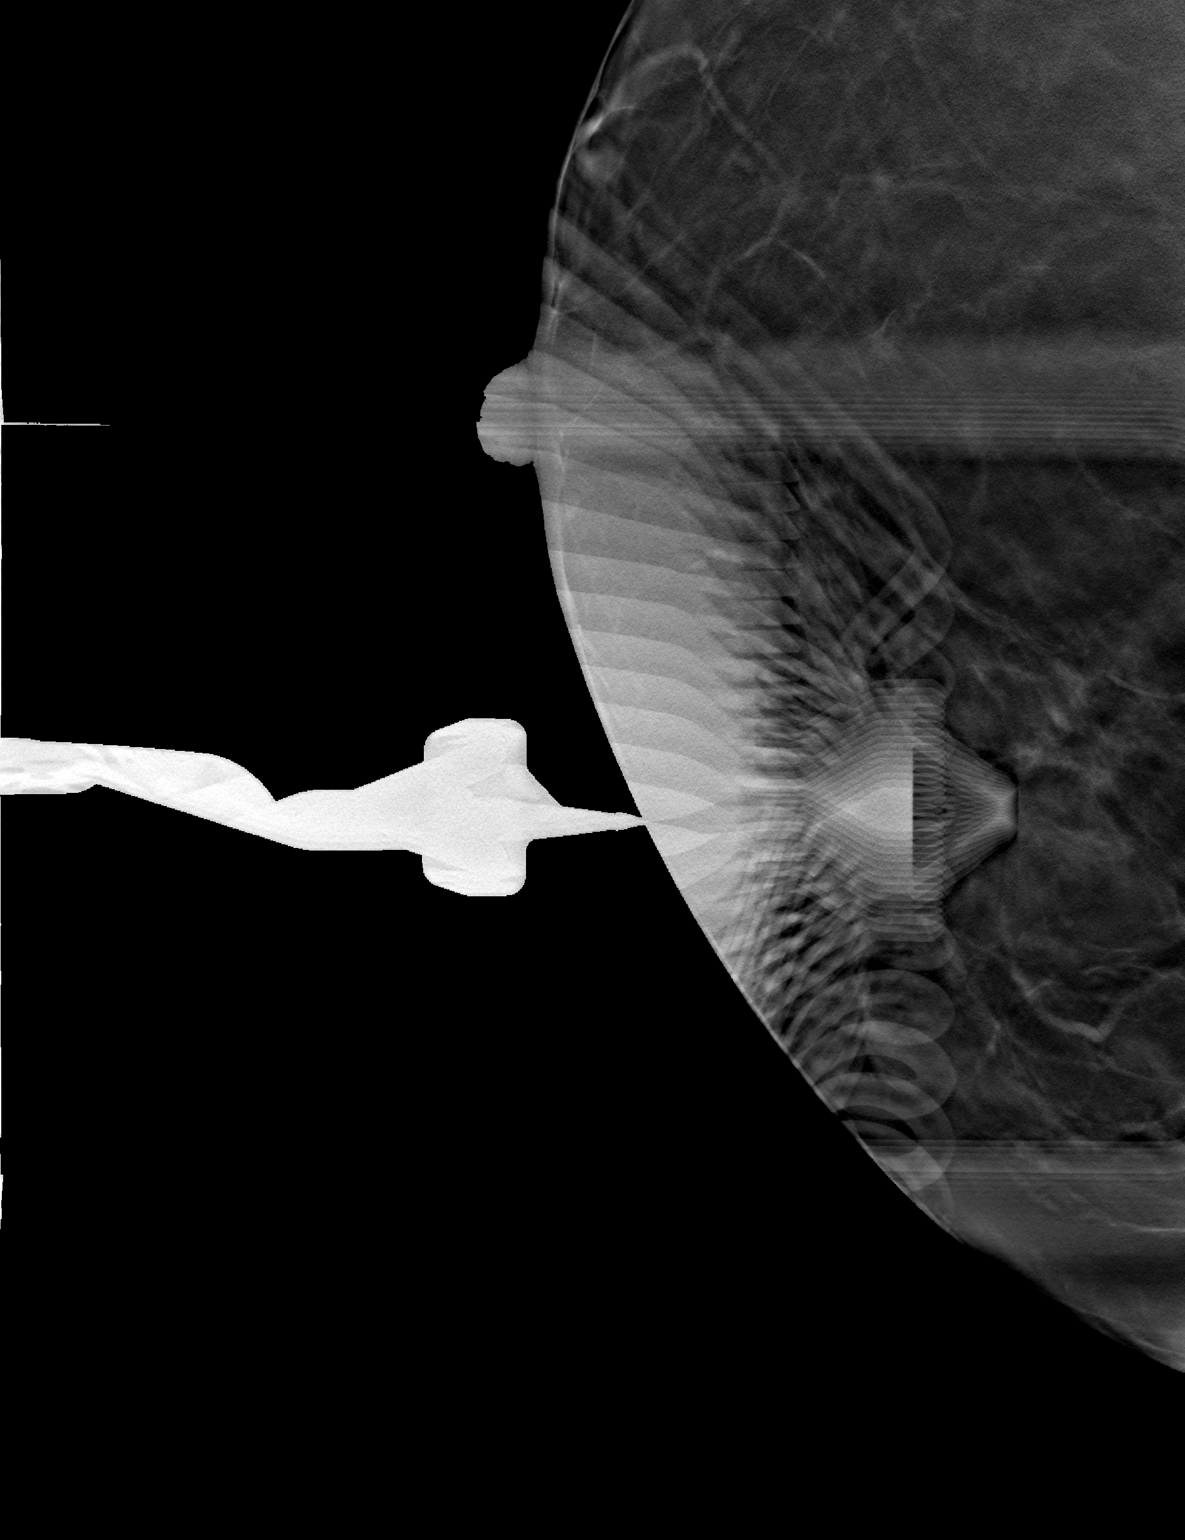

[L CC tomo (2 of 2) · tomo slice 33/64.0]
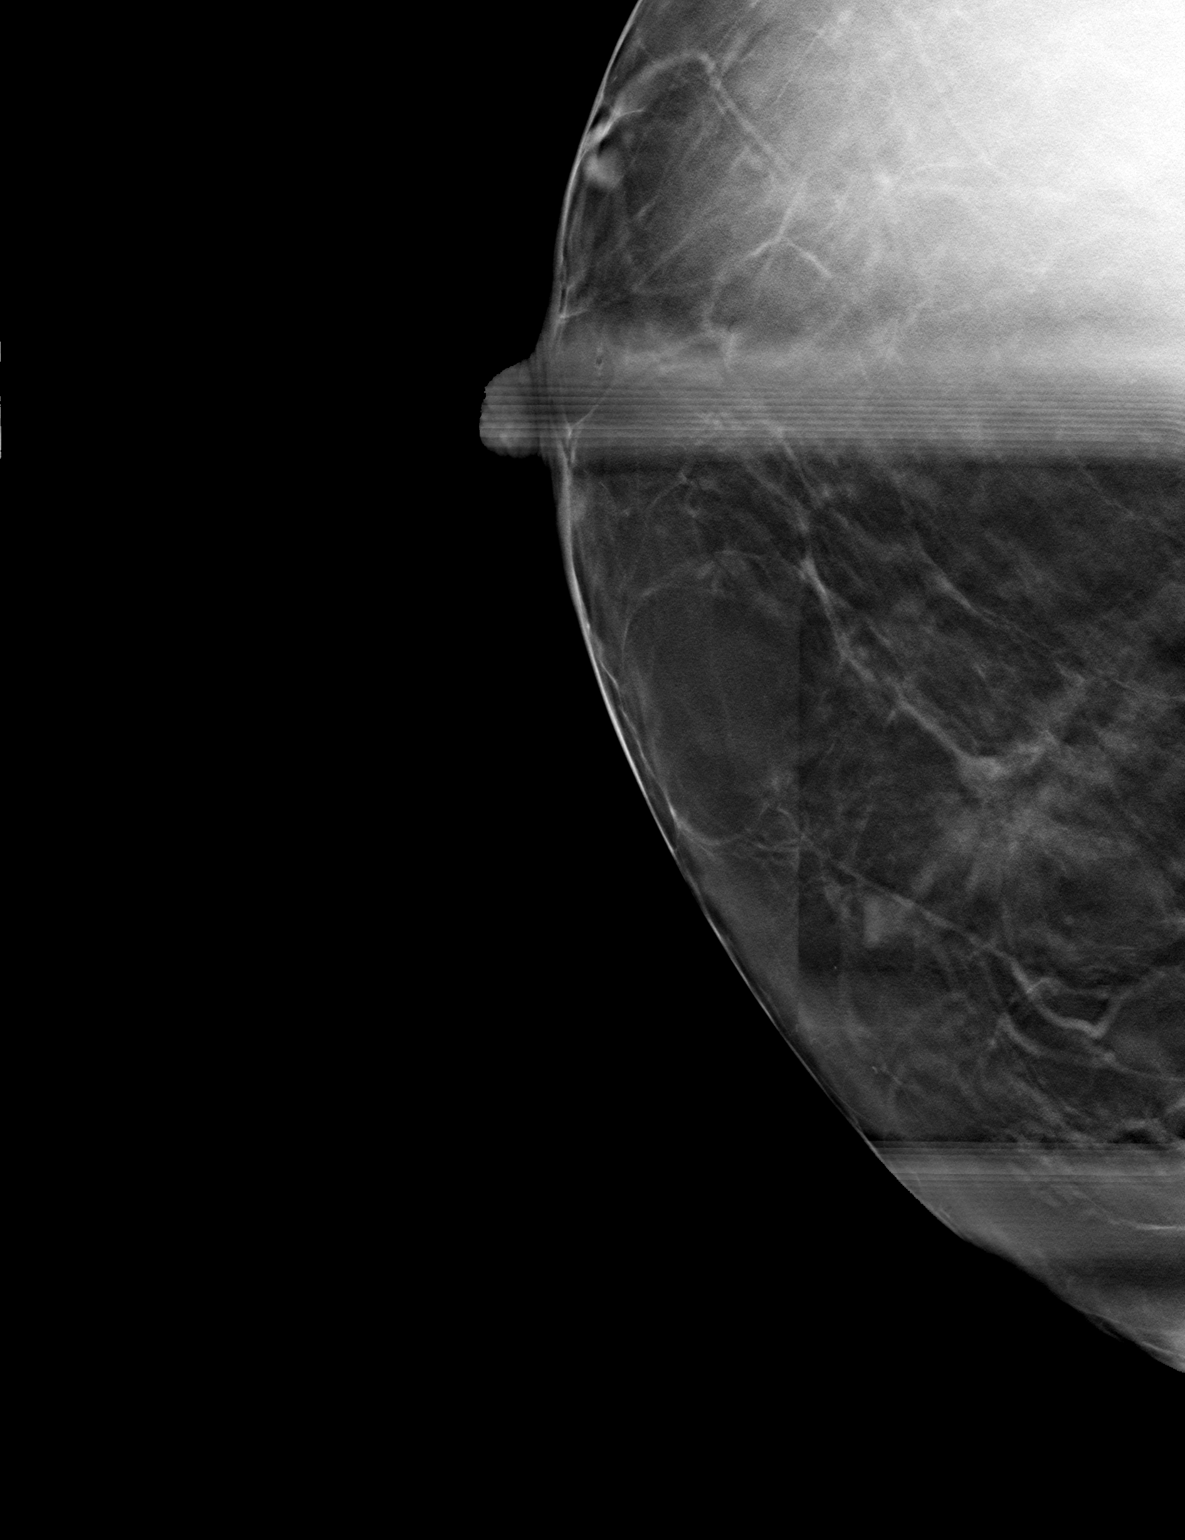

[3 of 11 positions shown; findings below may reference images not displayed]



Using sterile technique and 1% Lidocaine as local anesthetic, under
stereotactic guidance, a 9 gauge vacuum assisted device was used to
perform core needle biopsy of distortion in the upper outer left
breast using a superior approach.

Lesion quadrant: Upper-outer

At the conclusion of the procedure, a coil shaped tissue marker clip
was deployed into the biopsy cavity. Follow-up 2-view mammogram was
performed and dictated separately.
IMPRESSION: Stereotactic-guided biopsy of left breast distortion. No apparent
complications.

## 2019-06-06 DIAGNOSIS — R110 Nausea: Secondary | ICD-10-CM | POA: Diagnosis not present

## 2019-06-06 DIAGNOSIS — R109 Unspecified abdominal pain: Secondary | ICD-10-CM | POA: Diagnosis not present

## 2019-06-27 DIAGNOSIS — K219 Gastro-esophageal reflux disease without esophagitis: Secondary | ICD-10-CM | POA: Diagnosis not present

## 2019-06-27 DIAGNOSIS — Z23 Encounter for immunization: Secondary | ICD-10-CM | POA: Diagnosis not present

## 2019-06-27 DIAGNOSIS — F411 Generalized anxiety disorder: Secondary | ICD-10-CM | POA: Diagnosis not present

## 2019-06-27 DIAGNOSIS — E079 Disorder of thyroid, unspecified: Secondary | ICD-10-CM | POA: Diagnosis not present

## 2019-06-27 DIAGNOSIS — I10 Essential (primary) hypertension: Secondary | ICD-10-CM | POA: Diagnosis not present

## 2019-07-16 DIAGNOSIS — Z1212 Encounter for screening for malignant neoplasm of rectum: Secondary | ICD-10-CM | POA: Diagnosis not present

## 2019-07-16 DIAGNOSIS — Z1211 Encounter for screening for malignant neoplasm of colon: Secondary | ICD-10-CM | POA: Diagnosis not present

## 2019-09-17 DIAGNOSIS — D509 Iron deficiency anemia, unspecified: Secondary | ICD-10-CM | POA: Diagnosis not present

## 2019-09-17 DIAGNOSIS — Z3202 Encounter for pregnancy test, result negative: Secondary | ICD-10-CM | POA: Diagnosis not present

## 2019-09-17 DIAGNOSIS — N939 Abnormal uterine and vaginal bleeding, unspecified: Secondary | ICD-10-CM | POA: Diagnosis not present

## 2019-12-17 DIAGNOSIS — Z23 Encounter for immunization: Secondary | ICD-10-CM | POA: Diagnosis not present

## 2019-12-26 DIAGNOSIS — K219 Gastro-esophageal reflux disease without esophagitis: Secondary | ICD-10-CM | POA: Diagnosis not present

## 2019-12-26 DIAGNOSIS — I10 Essential (primary) hypertension: Secondary | ICD-10-CM | POA: Diagnosis not present

## 2019-12-26 DIAGNOSIS — E079 Disorder of thyroid, unspecified: Secondary | ICD-10-CM | POA: Diagnosis not present

## 2019-12-26 DIAGNOSIS — Z1331 Encounter for screening for depression: Secondary | ICD-10-CM | POA: Diagnosis not present

## 2019-12-26 DIAGNOSIS — F411 Generalized anxiety disorder: Secondary | ICD-10-CM | POA: Diagnosis not present

## 2020-01-07 DIAGNOSIS — Z23 Encounter for immunization: Secondary | ICD-10-CM | POA: Diagnosis not present

## 2020-05-09 ENCOUNTER — Other Ambulatory Visit: Payer: Self-pay | Admitting: Obstetrics & Gynecology

## 2020-05-09 DIAGNOSIS — Z1231 Encounter for screening mammogram for malignant neoplasm of breast: Secondary | ICD-10-CM

## 2020-06-24 ENCOUNTER — Ambulatory Visit
Admission: RE | Admit: 2020-06-24 | Discharge: 2020-06-24 | Disposition: A | Discharge status: Home / Self Care | Payer: BC Managed Care – PPO | Source: Ambulatory Visit | Attending: Obstetrics & Gynecology | Admitting: Obstetrics & Gynecology

## 2020-06-24 ENCOUNTER — Other Ambulatory Visit: Payer: Self-pay

## 2020-06-24 DIAGNOSIS — Z1231 Encounter for screening mammogram for malignant neoplasm of breast: Secondary | ICD-10-CM

## 2020-06-24 DIAGNOSIS — Z01419 Encounter for gynecological examination (general) (routine) without abnormal findings: Secondary | ICD-10-CM | POA: Diagnosis not present

## 2020-06-24 DIAGNOSIS — Z6841 Body Mass Index (BMI) 40.0 and over, adult: Secondary | ICD-10-CM | POA: Diagnosis not present

## 2020-06-26 DIAGNOSIS — K219 Gastro-esophageal reflux disease without esophagitis: Secondary | ICD-10-CM | POA: Diagnosis not present

## 2020-06-26 DIAGNOSIS — E079 Disorder of thyroid, unspecified: Secondary | ICD-10-CM | POA: Diagnosis not present

## 2020-06-26 DIAGNOSIS — Z23 Encounter for immunization: Secondary | ICD-10-CM | POA: Diagnosis not present

## 2020-06-26 DIAGNOSIS — I10 Essential (primary) hypertension: Secondary | ICD-10-CM | POA: Diagnosis not present

## 2020-06-26 DIAGNOSIS — F411 Generalized anxiety disorder: Secondary | ICD-10-CM | POA: Diagnosis not present

## 2020-12-05 DIAGNOSIS — K219 Gastro-esophageal reflux disease without esophagitis: Secondary | ICD-10-CM | POA: Diagnosis not present

## 2020-12-05 DIAGNOSIS — E079 Disorder of thyroid, unspecified: Secondary | ICD-10-CM | POA: Diagnosis not present

## 2020-12-05 DIAGNOSIS — I10 Essential (primary) hypertension: Secondary | ICD-10-CM | POA: Diagnosis not present

## 2020-12-05 DIAGNOSIS — M79605 Pain in left leg: Secondary | ICD-10-CM | POA: Diagnosis not present

## 2021-01-13 DIAGNOSIS — M79605 Pain in left leg: Secondary | ICD-10-CM | POA: Diagnosis not present

## 2021-01-13 DIAGNOSIS — R600 Localized edema: Secondary | ICD-10-CM | POA: Diagnosis not present

## 2021-06-11 DIAGNOSIS — I10 Essential (primary) hypertension: Secondary | ICD-10-CM | POA: Diagnosis not present

## 2021-06-11 DIAGNOSIS — E079 Disorder of thyroid, unspecified: Secondary | ICD-10-CM | POA: Diagnosis not present

## 2021-06-11 DIAGNOSIS — F411 Generalized anxiety disorder: Secondary | ICD-10-CM | POA: Diagnosis not present

## 2021-06-11 DIAGNOSIS — R7303 Prediabetes: Secondary | ICD-10-CM | POA: Diagnosis not present

## 2021-06-11 DIAGNOSIS — Z1331 Encounter for screening for depression: Secondary | ICD-10-CM | POA: Diagnosis not present

## 2021-06-11 DIAGNOSIS — E785 Hyperlipidemia, unspecified: Secondary | ICD-10-CM | POA: Diagnosis not present

## 2021-06-11 DIAGNOSIS — K219 Gastro-esophageal reflux disease without esophagitis: Secondary | ICD-10-CM | POA: Diagnosis not present

## 2021-06-15 ENCOUNTER — Other Ambulatory Visit: Payer: Self-pay | Admitting: Obstetrics & Gynecology

## 2021-06-15 DIAGNOSIS — Z1231 Encounter for screening mammogram for malignant neoplasm of breast: Secondary | ICD-10-CM

## 2021-07-03 DIAGNOSIS — D225 Melanocytic nevi of trunk: Secondary | ICD-10-CM | POA: Diagnosis not present

## 2021-07-03 DIAGNOSIS — D485 Neoplasm of uncertain behavior of skin: Secondary | ICD-10-CM | POA: Diagnosis not present

## 2021-09-08 ENCOUNTER — Ambulatory Visit
Admission: RE | Admit: 2021-09-08 | Discharge: 2021-09-08 | Disposition: A | Discharge status: Home / Self Care | Payer: BC Managed Care – PPO | Source: Ambulatory Visit | Attending: Obstetrics & Gynecology | Admitting: Obstetrics & Gynecology

## 2021-09-08 DIAGNOSIS — D649 Anemia, unspecified: Secondary | ICD-10-CM | POA: Diagnosis not present

## 2021-09-08 DIAGNOSIS — Z6841 Body Mass Index (BMI) 40.0 and over, adult: Secondary | ICD-10-CM | POA: Diagnosis not present

## 2021-09-08 DIAGNOSIS — Z1231 Encounter for screening mammogram for malignant neoplasm of breast: Secondary | ICD-10-CM

## 2021-09-08 DIAGNOSIS — Z01419 Encounter for gynecological examination (general) (routine) without abnormal findings: Secondary | ICD-10-CM | POA: Diagnosis not present

## 2021-09-10 ENCOUNTER — Other Ambulatory Visit: Payer: Self-pay | Admitting: Obstetrics & Gynecology

## 2021-09-10 DIAGNOSIS — R928 Other abnormal and inconclusive findings on diagnostic imaging of breast: Secondary | ICD-10-CM

## 2021-09-11 DIAGNOSIS — R6889 Other general symptoms and signs: Secondary | ICD-10-CM | POA: Diagnosis not present

## 2021-09-11 DIAGNOSIS — Z6841 Body Mass Index (BMI) 40.0 and over, adult: Secondary | ICD-10-CM | POA: Diagnosis not present

## 2021-09-11 DIAGNOSIS — J069 Acute upper respiratory infection, unspecified: Secondary | ICD-10-CM | POA: Diagnosis not present

## 2021-10-15 ENCOUNTER — Other Ambulatory Visit: Payer: Self-pay | Admitting: Obstetrics & Gynecology

## 2021-10-15 ENCOUNTER — Ambulatory Visit
Admission: RE | Admit: 2021-10-15 | Discharge: 2021-10-15 | Disposition: A | Discharge status: Home / Self Care | Payer: 59 | Source: Ambulatory Visit | Attending: Obstetrics & Gynecology | Admitting: Obstetrics & Gynecology

## 2021-10-15 DIAGNOSIS — R928 Other abnormal and inconclusive findings on diagnostic imaging of breast: Secondary | ICD-10-CM

## 2021-10-15 DIAGNOSIS — R921 Mammographic calcification found on diagnostic imaging of breast: Secondary | ICD-10-CM

## 2022-05-28 ENCOUNTER — Other Ambulatory Visit: Payer: Self-pay | Admitting: Obstetrics & Gynecology

## 2022-05-28 ENCOUNTER — Ambulatory Visit
Admission: RE | Admit: 2022-05-28 | Discharge: 2022-05-28 | Disposition: A | Discharge status: Home / Self Care | Payer: 59 | Source: Ambulatory Visit | Attending: Obstetrics & Gynecology | Admitting: Obstetrics & Gynecology

## 2022-05-28 DIAGNOSIS — R921 Mammographic calcification found on diagnostic imaging of breast: Secondary | ICD-10-CM

## 2022-10-01 ENCOUNTER — Ambulatory Visit
Admission: RE | Admit: 2022-10-01 | Discharge: 2022-10-01 | Disposition: A | Discharge status: Home / Self Care | Payer: Managed Care, Other (non HMO) | Source: Ambulatory Visit | Attending: Obstetrics & Gynecology | Admitting: Obstetrics & Gynecology

## 2022-10-01 DIAGNOSIS — R921 Mammographic calcification found on diagnostic imaging of breast: Secondary | ICD-10-CM

## 2023-09-20 ENCOUNTER — Other Ambulatory Visit: Payer: Self-pay | Admitting: Obstetrics & Gynecology

## 2023-09-20 DIAGNOSIS — Z1231 Encounter for screening mammogram for malignant neoplasm of breast: Secondary | ICD-10-CM

## 2023-09-20 DIAGNOSIS — R921 Mammographic calcification found on diagnostic imaging of breast: Secondary | ICD-10-CM

## 2023-11-22 ENCOUNTER — Ambulatory Visit
Admission: RE | Admit: 2023-11-22 | Discharge: 2023-11-22 | Disposition: A | Discharge status: Home / Self Care | Payer: Managed Care, Other (non HMO) | Source: Ambulatory Visit | Attending: Obstetrics & Gynecology | Admitting: Obstetrics & Gynecology

## 2023-11-22 DIAGNOSIS — R921 Mammographic calcification found on diagnostic imaging of breast: Secondary | ICD-10-CM
